# Patient Record
Sex: Female | Born: 1990 | Race: Black or African American | Hispanic: No | Marital: Single | Smoking: Never smoker
Health system: Southern US, Community
[De-identification: ages and names within clinical notes are randomized; demographics above are authoritative.]

## PROBLEM LIST (undated history)

## (undated) ENCOUNTER — Inpatient Hospital Stay (HOSPITAL_COMMUNITY): Payer: Self-pay

## (undated) DIAGNOSIS — R51 Headache: Secondary | ICD-10-CM

## (undated) DIAGNOSIS — R519 Headache, unspecified: Secondary | ICD-10-CM

## (undated) DIAGNOSIS — J45909 Unspecified asthma, uncomplicated: Secondary | ICD-10-CM

## (undated) DIAGNOSIS — Z202 Contact with and (suspected) exposure to infections with a predominantly sexual mode of transmission: Secondary | ICD-10-CM

## (undated) DIAGNOSIS — A749 Chlamydial infection, unspecified: Secondary | ICD-10-CM

## (undated) DIAGNOSIS — R011 Cardiac murmur, unspecified: Secondary | ICD-10-CM

## (undated) HISTORY — PX: MOUTH SURGERY: SHX715

---

## 2018-06-26 ENCOUNTER — Encounter (HOSPITAL_COMMUNITY): Payer: Self-pay | Admitting: *Deleted

## 2018-06-26 ENCOUNTER — Inpatient Hospital Stay (HOSPITAL_COMMUNITY)
Admission: AD | Admit: 2018-06-26 | Discharge: 2018-06-26 | Disposition: A | Discharge status: Home / Self Care | Payer: Self-pay | Source: Ambulatory Visit | Attending: Obstetrics and Gynecology | Admitting: Obstetrics and Gynecology

## 2018-06-26 DIAGNOSIS — O0932 Supervision of pregnancy with insufficient antenatal care, second trimester: Secondary | ICD-10-CM | POA: Insufficient documentation

## 2018-06-26 DIAGNOSIS — O26892 Other specified pregnancy related conditions, second trimester: Secondary | ICD-10-CM | POA: Insufficient documentation

## 2018-06-26 DIAGNOSIS — A599 Trichomoniasis, unspecified: Secondary | ICD-10-CM | POA: Insufficient documentation

## 2018-06-26 DIAGNOSIS — N949 Unspecified condition associated with female genital organs and menstrual cycle: Secondary | ICD-10-CM

## 2018-06-26 DIAGNOSIS — Z3A19 19 weeks gestation of pregnancy: Secondary | ICD-10-CM | POA: Insufficient documentation

## 2018-06-26 DIAGNOSIS — J45909 Unspecified asthma, uncomplicated: Secondary | ICD-10-CM | POA: Insufficient documentation

## 2018-06-26 DIAGNOSIS — A749 Chlamydial infection, unspecified: Secondary | ICD-10-CM | POA: Insufficient documentation

## 2018-06-26 DIAGNOSIS — O98812 Other maternal infectious and parasitic diseases complicating pregnancy, second trimester: Secondary | ICD-10-CM | POA: Insufficient documentation

## 2018-06-26 DIAGNOSIS — O99512 Diseases of the respiratory system complicating pregnancy, second trimester: Secondary | ICD-10-CM | POA: Insufficient documentation

## 2018-06-26 DIAGNOSIS — R102 Pelvic and perineal pain: Secondary | ICD-10-CM | POA: Insufficient documentation

## 2018-06-26 DIAGNOSIS — R109 Unspecified abdominal pain: Secondary | ICD-10-CM

## 2018-06-26 DIAGNOSIS — O26899 Other specified pregnancy related conditions, unspecified trimester: Secondary | ICD-10-CM

## 2018-06-26 HISTORY — DX: Headache: R51

## 2018-06-26 HISTORY — DX: Contact with and (suspected) exposure to infections with a predominantly sexual mode of transmission: Z20.2

## 2018-06-26 HISTORY — DX: Cardiac murmur, unspecified: R01.1

## 2018-06-26 HISTORY — DX: Chlamydial infection, unspecified: A74.9

## 2018-06-26 HISTORY — DX: Headache, unspecified: R51.9

## 2018-06-26 HISTORY — DX: Unspecified asthma, uncomplicated: J45.909

## 2018-06-26 LAB — URINALYSIS, ROUTINE W REFLEX MICROSCOPIC
Bilirubin Urine: NEGATIVE
GLUCOSE, UA: NEGATIVE mg/dL
KETONES UR: NEGATIVE mg/dL
NITRITE: NEGATIVE
PH: 5 (ref 5.0–8.0)
Protein, ur: NEGATIVE mg/dL
Specific Gravity, Urine: 1.031 — ABNORMAL HIGH (ref 1.005–1.030)

## 2018-06-26 LAB — WET PREP, GENITAL
Clue Cells Wet Prep HPF POC: NONE SEEN
SPERM: NONE SEEN
TRICH WET PREP: NONE SEEN
Yeast Wet Prep HPF POC: NONE SEEN

## 2018-06-26 NOTE — MAU Provider Note (Addendum)
History     CSN: [id number]  Arrival date and time: 06/26/18 0844   First Provider Initiated Contact with Patient 06/26/18 [phone]      Chief Complaint  Patient presents with  . no fetal movement   G3P2002 @19 .4 wks by LMP here to "check on the baby". Moved here from [facility] yesterday d/t "stressful situation". She denies VB but reports LAP, intermittently throughout the pregnancy. Pain is more mild today. Describes as sharp and shooting. She is not feeling FM yet. Did not receive care in [place] but had several ED visits. Reports being treated for Trich earlier in the pregnancy. Currently staying at [facility] with her daughter.    OB History    Gravida  3   Para  2   Term  2   Preterm  0   AB  0   Living  2     SAB      TAB      Ectopic      Multiple      Live Births              Past Medical History:  Diagnosis Date  . Asthma   . Chlamydia   . Headache   . Heart murmur   . Trichomonas contact     Past Surgical History:  Procedure Laterality Date  . MOUTH SURGERY      No family history on file.  Social History   Tobacco Use  . Smoking status: Never Smoker  . Smokeless tobacco: Never Used  Substance Use Topics  . Alcohol use: Never    Frequency: Never  . Drug use: Never    Allergies: Allergies not on file  No medications prior to admission.    Review of Systems  Gastrointestinal: Positive for abdominal pain.  Genitourinary: Negative for vaginal bleeding.   Physical Exam   Blood pressure 104/61, pulse 85, temperature 98.2 F (36.8 C), temperature source Oral, resp. rate 20, height 5\' 3"  (1.6 m), weight 68 kg.  Physical Exam  Constitutional: She is oriented to person, place, and time. She appears well-developed and well-nourished. No distress.  HENT:  Head: Normocephalic and atraumatic.  Neck: Normal range of motion.  Cardiovascular: Normal rate.  Respiratory: Effort normal. No respiratory distress.  GI: Soft. She exhibits no  distension. There is no tenderness.  Gravid @unbilicus    Genitourinary:  Genitourinary Comments: VE: closed/long  Musculoskeletal: Normal range of motion.  Neurological: She is alert and oriented to person, place, and time.  Skin: Skin is warm and dry.  Psychiatric: She has a normal mood and affect.  FHT 160  Results for orders placed or performed during the hospital encounter of 06/26/18 (from the past 24 hour(s))  Wet prep, genital     Status: Abnormal   Collection Time: 06/26/18  9:48 AM  Result Value Ref Range   Yeast Wet Prep HPF POC NONE SEEN NONE SEEN   Trich, Wet Prep NONE SEEN NONE SEEN   Clue Cells Wet Prep HPF POC NONE SEEN NONE SEEN   WBC, Wet Prep HPF POC FEW (A) NONE SEEN   Sperm NONE SEEN   Urinalysis, Routine w reflex microscopic     Status: Abnormal   Collection Time: 06/26/18  9:50 AM  Result Value Ref Range   Color, Urine YELLOW YELLOW   APPearance HAZY (A) CLEAR   Specific Gravity, Urine 1.031 (H) 1.005 - 1.030   pH 5.0 5.0 - 8.0   Glucose, UA  NEGATIVE NEGATIVE mg/dL   Hgb urine dipstick MODERATE (A) NEGATIVE   Bilirubin Urine NEGATIVE NEGATIVE   Ketones, ur NEGATIVE NEGATIVE mg/dL   Protein, ur NEGATIVE NEGATIVE mg/dL   Nitrite NEGATIVE NEGATIVE   Leukocytes, UA SMALL (A) NEGATIVE   RBC / HPF 21-50 0 - 5 RBC/hpf   WBC, UA 6-10 0 - 5 WBC/hpf   Bacteria, UA RARE (A) NONE SEEN   Squamous Epithelial / LPF 11-20 0 - 5   Mucus PRESENT    MAU Course  Procedures  MDM Labs ordered and reviewed. UA with Hgb, asymptomatic, will send UC. No evidence imminent SAB. St[facility] the [facility] provided. Pregnancy verification letter provided. Stable for discharge home.   Assessment and Plan   1. [redacted] weeks gestation of pregnancy   2. Abdominal pain affecting pregnancy   3. Round ligament pain   4. Insufficient prenatal care in second trimester    Discharge home Follow up with OB provider of choice asap to start care SAB/return  precautions  Allergies as of 06/26/2018   Not on File     Medication List    You have not been prescribed any medications.    Donette LarryMelanie Preslynn Bier, CNM 06/26/2018, 10:56 AM

## 2018-06-26 NOTE — MAU Note (Signed)
[facility], was getting [facility] there, hasn't established care here.  States her last prenatal visit was in June.  Has not started feeling fetal movement at all.  Has occasional lower abdominal pain, intermittent, none today.  Denies bleeding.

## 2018-06-26 NOTE — Discharge Instructions (Signed)
Second Trimester of Pregnancy The second trimester is from week 13 through week 28, month 4 through 6. This is often the time in pregnancy that you feel your best. Often times, morning sickness has lessened or quit. You may have more energy, and you may get hungry more often. Your unborn baby (fetus) is growing rapidly. At the end of the sixth month, he or she is about 9 inches long and weighs about 1 pounds. You will likely feel the baby move (quickening) between 18 and 20 weeks of pregnancy. Follow these instructions at home:  Avoid all smoking, herbs, and alcohol. Avoid drugs not approved by your doctor.  Do not use any tobacco products, including cigarettes, chewing tobacco, and electronic cigarettes. If you need help quitting, ask your doctor. You may get counseling or other support to help you quit.  Only take medicine as told by your doctor. Some medicines are safe and some are not during pregnancy.  Exercise only as told by your doctor. Stop exercising if you start having cramps.  Eat regular, healthy meals.  Wear a good support bra if your breasts are tender.  Do not use hot tubs, steam rooms, or saunas.  Wear your seat belt when driving.  Avoid raw meat, uncooked cheese, and liter boxes and soil used by cats.  Take your prenatal vitamins.  Take 1500-2000 milligrams of calcium daily starting at the 20th week of pregnancy until you deliver your baby.  Try taking medicine that helps you poop (stool softener) as needed, and if your doctor approves. Eat more fiber by eating fresh fruit, vegetables, and whole grains. Drink enough fluids to keep your pee (urine) clear or pale yellow.  Take warm water baths (sitz baths) to soothe pain or discomfort caused by hemorrhoids. Use hemorrhoid cream if your doctor approves.  If you have puffy, bulging veins (varicose veins), wear support hose. Raise (elevate) your feet for 15 minutes, 3-4 times a day. Limit salt in your diet.  Avoid heavy  lifting, wear low heals, and sit up straight.  Rest with your legs raised if you have leg cramps or low back pain.  Visit your dentist if you have not gone during your pregnancy. Use a soft toothbrush to brush your teeth. Be gentle when you floss.  You can have sex (intercourse) unless your doctor tells you not to.  Go to your doctor visits. Get help if:  You feel dizzy.  You have mild cramps or pressure in your lower belly (abdomen).  You have a nagging pain in your belly area.  You continue to feel sick to your stomach (nauseous), throw up (vomit), or have watery poop (diarrhea).  You have bad smelling fluid coming from your vagina.  You have pain with peeing (urination). Get help right away if:  You have a fever.  You are leaking fluid from your vagina.  You have spotting or bleeding from your vagina.  You have severe belly cramping or pain.  You lose or gain weight rapidly.  You have trouble catching your breath and have chest pain.  You notice sudden or extreme puffiness (swelling) of your face, hands, ankles, feet, or legs.  You have not felt the baby move in over an hour.  You have severe headaches that do not go away with medicine.  You have vision changes. This information is not intended to replace advice given to you by your health care provider. Make sure you discuss any questions you have with your health care  provider. Document Released: 01/10/2010 Document Revised: 03/23/2016 Document Reviewed: 12/17/2012 Elsevier Interactive Patient Education  2017 Elsevier Inc.    Abdominal Pain During Pregnancy Abdominal pain is common in pregnancy. Most of the time, it does not cause harm. There are many causes of abdominal pain. Some causes are more serious than others and sometimes the cause is not known. Abdominal pain can be a sign that something is very wrong with the pregnancy or the pain may have nothing to do with the pregnancy. Always tell your health  care provider if you have any abdominal pain. Follow these instructions at home:  Do not have sex or put anything in your vagina until your symptoms go away completely.  Watch your abdominal pain for any changes.  Get plenty of rest until your pain improves.  Drink enough fluid to keep your urine clear or pale yellow.  Take over-the-counter or prescription medicines only as told by your health care provider.  Keep all follow-up visits as told by your health care provider. This is important. Contact a health care provider if:  You have a fever.  Your pain gets worse or you have cramping.  Your pain continues after resting. Get help right away if:  You are bleeding, leaking fluid, or passing tissue from the vagina.  You have vomiting or diarrhea that does not go away.  You have painful or bloody urination.  You notice a decrease in your baby's movements.  You feel very weak or faint.  You have shortness of breath.  You develop a severe headache with abdominal pain.  You have abnormal vaginal discharge with abdominal pain. This information is not intended to replace advice given to you by your health care provider. Make sure you discuss any questions you have with your health care provider. Document Released: 10/16/2005 Document Revised: 07/27/2016 Document Reviewed: 05/15/2013 Elsevier Interactive Patient Education  [employer]2018 Elsevier Inc.

## 2018-06-27 ENCOUNTER — Telehealth: Payer: Self-pay | Admitting: General Practice

## 2018-06-27 LAB — CULTURE, OB URINE

## 2018-06-27 NOTE — Telephone Encounter (Signed)
Patient aware of appt change to Friday, 07/05/18 at 10:00am.

## 2018-06-28 LAB — GC/CHLAMYDIA PROBE AMP (~~LOC~~) NOT AT ARMC
Chlamydia: NEGATIVE
Neisseria Gonorrhea: NEGATIVE

## 2018-07-10 ENCOUNTER — Other Ambulatory Visit: Payer: Self-pay

## 2018-07-10 ENCOUNTER — Ambulatory Visit: Payer: Self-pay | Admitting: *Deleted

## 2018-07-10 DIAGNOSIS — Z3480 Encounter for supervision of other normal pregnancy, unspecified trimester: Secondary | ICD-10-CM | POA: Insufficient documentation

## 2018-07-10 LAB — POCT URINALYSIS DIPSTICK OB
BILIRUBIN UA: NEGATIVE
Glucose, UA: NEGATIVE
KETONES UA: NEGATIVE
Nitrite, UA: NEGATIVE
PH UA: 7 (ref 5.0–8.0)
POC,PROTEIN,UA: NEGATIVE
RBC UA: NEGATIVE
Spec Grav, UA: 1.025 (ref 1.010–1.025)
UROBILINOGEN UA: 0.2 U/dL

## 2018-07-10 NOTE — Progress Notes (Signed)
   PRENATAL INTAKE SUMMARY  Ms. Rote presents today New OB Nurse Interview.  OB History    Gravida  3   Para  2   Term  2   Preterm  0   AB  0   Living  2     SAB      TAB      Ectopic      Multiple      Live Births  2          I have reviewed the patient's medical, obstetrical, social, and family histories, medications, and available lab results.  SUBJECTIVE She has no unusual complaints. Late to prenatal care.  Pt is staying at [facility]. Pt recently just moved to [place].    OBJECTIVE Initial nurse interview for history and lab work (New OB).  GENERAL APPEARANCE: alert, well appearing.  ASSESSMENT Normal pregnancy. EDD: 11/14/2018; GA: [redacted]w[redacted]d; G3P2.  PLAN Prenatal care: [facility]. OB Pnl/HIV  OB Urine Culture GC/CT: negative 06/26/18 at MAU. HgbEval SMA CF A1C PAP at next visit with CNM. Pt declined prenatal labs today due to no insurance. Pt stated she will go to social services to reapply for her medicaid.  Pt to continue with PNV.  Preterm labor precautions given.   Clovis Pu, RN

## 2018-07-14 ENCOUNTER — Inpatient Hospital Stay (HOSPITAL_COMMUNITY)
Admission: AD | Admit: 2018-07-14 | Discharge: 2018-07-14 | Disposition: A | Discharge status: Home / Self Care | Payer: Self-pay | Source: Ambulatory Visit | Attending: Family Medicine | Admitting: Family Medicine

## 2018-07-14 ENCOUNTER — Other Ambulatory Visit: Payer: Self-pay

## 2018-07-14 ENCOUNTER — Inpatient Hospital Stay (HOSPITAL_BASED_OUTPATIENT_CLINIC_OR_DEPARTMENT_OTHER): Payer: Self-pay

## 2018-07-14 ENCOUNTER — Encounter (HOSPITAL_COMMUNITY): Payer: Self-pay

## 2018-07-14 DIAGNOSIS — Z91040 Latex allergy status: Secondary | ICD-10-CM | POA: Insufficient documentation

## 2018-07-14 DIAGNOSIS — O0932 Supervision of pregnancy with insufficient antenatal care, second trimester: Secondary | ICD-10-CM

## 2018-07-14 DIAGNOSIS — Z3A22 22 weeks gestation of pregnancy: Secondary | ICD-10-CM

## 2018-07-14 DIAGNOSIS — O4690 Antepartum hemorrhage, unspecified, unspecified trimester: Secondary | ICD-10-CM

## 2018-07-14 DIAGNOSIS — O26899 Other specified pregnancy related conditions, unspecified trimester: Secondary | ICD-10-CM

## 2018-07-14 DIAGNOSIS — Z3686 Encounter for antenatal screening for cervical length: Secondary | ICD-10-CM

## 2018-07-14 DIAGNOSIS — R102 Pelvic and perineal pain: Secondary | ICD-10-CM

## 2018-07-14 DIAGNOSIS — O4692 Antepartum hemorrhage, unspecified, second trimester: Secondary | ICD-10-CM

## 2018-07-14 DIAGNOSIS — Z3480 Encounter for supervision of other normal pregnancy, unspecified trimester: Secondary | ICD-10-CM

## 2018-07-14 DIAGNOSIS — O26892 Other specified pregnancy related conditions, second trimester: Secondary | ICD-10-CM

## 2018-07-14 LAB — URINALYSIS, ROUTINE W REFLEX MICROSCOPIC
Bilirubin Urine: NEGATIVE
Glucose, UA: NEGATIVE mg/dL
Hgb urine dipstick: NEGATIVE
Ketones, ur: 5 mg/dL — AB
Leukocytes, UA: NEGATIVE
Nitrite: NEGATIVE
Protein, ur: NEGATIVE mg/dL
Specific Gravity, Urine: 1.025 (ref 1.005–1.030)
pH: 5 (ref 5.0–8.0)

## 2018-07-14 LAB — WET PREP, GENITAL
Clue Cells Wet Prep HPF POC: NONE SEEN
Sperm: NONE SEEN
Trich, Wet Prep: NONE SEEN
Yeast Wet Prep HPF POC: NONE SEEN

## 2018-07-14 LAB — ABO/RH: ABO/RH(D): A POS

## 2018-07-14 MED ORDER — ACETAMINOPHEN 500 MG PO TABS: 1000.0000 mg | ORAL_TABLET | Freq: Once | ORAL | Status: DC

## 2018-07-14 NOTE — MAU Note (Signed)
Pt. States abd pain started around 0800 this morning.  Pt noticed some pink spotting around 1445 today.  Pt. Denies LOF or dc.

## 2018-07-14 NOTE — Discharge Instructions (Signed)

## 2018-07-14 NOTE — MAU Provider Note (Addendum)
History     CSN: [id number]  Arrival date and time: 07/14/18 1503   First Provider Initiated Contact with Patient 07/14/18 1554      Chief Complaint  Patient presents with  . Abdominal Pain  . Vaginal Bleeding    spotting    Nichole Patterson is a 27 y.o. G3P2 at [redacted]w[redacted]d who presents to MAU with complaints of vaginal bleeding and abdominal pain. She reports vaginal bleeding started occurring today. Describes the vaginal bleeding as pinkish red spotting when she wipes, denies having to wear a pad or panty liner for bleeding. Reports it occurring once before a couple of weeks ago but was not seen after. She moved here from [place] and has not had an [place] so is unsure of placenta location or blood type. She reports abdominal pain started occurring this week. Describes the abdominal pain as intermittent sharp stabbing pain that gets worse with movement or when she is standing up. Rates pain 7/10- has not taken any medication for abdominal pain. She denies vaginal discharge, LOF, or recent IC. She reports +FM.    OB History    Gravida  3   Para  2   Term  2   Preterm  0   AB  0   Living  2     SAB      TAB      Ectopic      Multiple      Live Births  2           Past Medical History:  Diagnosis Date  . Asthma   . Chlamydia   . Headache   . Heart murmur   . Trichomonas contact     Past Surgical History:  Procedure Laterality Date  . MOUTH SURGERY      Family History  Problem Relation Age of Onset  . Pneumonia Father     Social History   Tobacco Use  . Smoking status: Never Smoker  . Smokeless tobacco: Never Used  Substance Use Topics  . Alcohol use: Never    Frequency: Never  . Drug use: Never    Allergies:  Allergies  Allergen Reactions  . Latex Itching    Redness    Medications Prior to Admission  Medication Sig Dispense Refill Last Dose  . Prenatal Vit-Fe Fumarate-FA (MULTIVITAMIN-PRENATAL) 27-0.8 MG TABS tablet Take 1 tablet by mouth daily at 12  noon.   Taking    Review of Systems  Constitutional: Negative.   Respiratory: Negative.   Cardiovascular: Negative.   Gastrointestinal: Positive for abdominal pain. Negative for constipation, diarrhea, nausea and vomiting.  Genitourinary: Positive for vaginal bleeding. Negative for difficulty urinating, dysuria, frequency, menstrual problem, pelvic pain and urgency.  Musculoskeletal: Negative.   Neurological: Negative.    Physical Exam   Blood pressure 116/64, pulse 92, temperature 97.9 F (36.6 C), temperature source Oral, resp. rate 18, height 5\' 3"  (1.6 m), weight 71.2 kg, last menstrual period 02/07/2018, SpO2 100 %.  Physical Exam  Nursing note and vitals reviewed. Constitutional: She is oriented to person, place, and time. She appears well-developed and well-nourished. No distress.  HENT:  Head: Normocephalic.  Cardiovascular: Normal rate, regular rhythm and normal heart sounds.  Respiratory: Effort normal and breath sounds normal. No respiratory distress. She has no wheezes.  GI: Soft. There is no tenderness. There is no rebound.  Gravid appropriate for gestational age, fundus at umbilicus  Genitourinary: There is bleeding in the vagina. No tenderness in the vagina. Vaginal  discharge found.  Genitourinary Comments: Pelvic exam: cervix pink, visually closed, white thin discharge with no odor present, vaginal bleeding present at cervical os (none surrounding cervix)    Musculoskeletal: Normal range of motion.  Neurological: She is alert and oriented to person, place, and time.  Psychiatric: She has a normal mood and affect. Her behavior is normal. Thought content normal.   Dilation: Closed Cervical Position: Posterior Exam by:: V Delayza Lungren CNM  FHR 143 by bedside US due to unable to obtain with doppler   MAU Course  Procedures  MDM Wet prep GC/C  ABO/Rh  US MFM OB limited to assess for placental location and cervical length   Labs reviewed:  Results for orders  placed or performed during the hospital encounter of 07/14/18 (from the past 24 hour(s))  Wet prep, genital     Status: Abnormal   Collection Time: 07/14/18  3:07 PM  Result Value Ref Range   Yeast Wet Prep HPF POC NONE SEEN NONE SEEN   Trich, Wet Prep NONE SEEN NONE SEEN   Clue Cells Wet Prep HPF POC NONE SEEN NONE SEEN   WBC, Wet Prep HPF POC FEW (A) NONE SEEN   Sperm NONE SEEN   Urinalysis, Routine w reflex microscopic     Status: Abnormal   Collection Time: 07/14/18  3:56 PM  Result Value Ref Range   Color, Urine YELLOW YELLOW   APPearance HAZY (A) CLEAR   Specific Gravity, Urine 1.025 1.005 - 1.030   pH 5.0 5.0 - 8.0   Glucose, UA NEGATIVE NEGATIVE mg/dL   Hgb urine dipstick NEGATIVE NEGATIVE   Bilirubin Urine NEGATIVE NEGATIVE   Ketones, ur 5 (A) NEGATIVE mg/dL   Protein, ur NEGATIVE NEGATIVE mg/dL   Nitrite NEGATIVE NEGATIVE   Leukocytes, UA NEGATIVE NEGATIVE  ABO/Rh     Status: None (Preliminary result)   Collection Time: 07/14/18  4:36 PM  Result Value Ref Range   ABO/RH(D)      A POS Performed at [facility], [address] [place], [place] [address]    Wet prep- negative  GC/C- pending  UA- negative  ABO/Rh- A Pos, no rhogam needed for vaginal bleeding   US reports reviewed: Placenta anterior above cervical os (no abruption or previa seen), Presentation cephalic, AFV: WNL,  CL 4.3cm  - no abnormalities noted from US  [place] Mfm Ob Limited  Result Date: 07/14/2018 ----------------------------------------------------------------------  OBSTETRICS REPORT                        (Signed Final 07/14/2018 07:26 pm) ---------------------------------------------------------------------- Patient Info  ID #:       [id number]                          D.O.B.:  [birth year] (27 yrs)  Name:       Nichole Patterson                      Visit Date: 07/14/2018 04:38 pm ---------------------------------------------------------------------- Performed By  Performed By:     Birdena CrandallYasemin Karatas         Ref. Address:      Faculty                    RDMS,RVT  Attending:        Toma CopierSaju Joy MD            Secondary Phy.:    MAU  Nursing-                                                              MAU/Triage  Referred By:      Rudean Curt             Location:          [facility]                    Tierrah Anastos CNM ---------------------------------------------------------------------- Orders   #  Description                           Code        Ordered By   1  [place] MFM OB LIMITED                     [phone]    Tauri Ethington  ----------------------------------------------------------------------   #  Order #                     Accession #                Episode #   1  [id number]                   [id number]                 [id number]  ---------------------------------------------------------------------- Indications   Encounter for cervical length                  Z36.86   Late prenatal care, second trimester           O09.32   Vaginal bleeding in pregnancy, second          O46.92   trimester   [redacted] weeks gestation of pregnancy                Z3A.22  ---------------------------------------------------------------------- Vital Signs  Weight (lb): 157                               Height:        5'3"  BMI:         27.81 ---------------------------------------------------------------------- Fetal Evaluation  Num Of Fetuses:          1  Fetal Heart Rate(bpm):   157  Cardiac Activity:        Observed  Presentation:            Cephalic  Placenta:                Anterior  P. Cord Insertion:       Visualized, central  Amniotic Fluid  AFI FV:      Within normal limits                              Largest Pocket(cm)                              5.6  Comment:    No placental abruption or previa identified. ---------------------------------------------------------------------- Biometry  CM:  5.6  mm ---------------------------------------------------------------------- OB History  Gravidity:    3         Term:   2        Prem:   0   Living:       2 ---------------------------------------------------------------------- Gestational Age  LMP:           22w 3d        Date:  02/07/18                 EDD:   11/14/18  Best:          Maudie Mercury 3d     Det. By:  LMP  (02/07/18)          EDD:   11/14/18 ---------------------------------------------------------------------- Anatomy  Cranium:               Appears normal         Aortic Arch:            Appears normal  Cavum:                 Appears normal         Ductal Arch:            Not well visualized  Ventricles:            Appears normal         Diaphragm:              Appears normal  Choroid Plexus:        Appears normal         Stomach:                Appears normal, left                                                                        sided  Cerebellum:            Appears normal         Abdomen:                Appears normal  Posterior Fossa:       Appears normal         Abdominal Wall:         Appears nml (cord                                                                        insert, abd wall)  Face:                  Appears normal         Cord Vessels:           Appears normal (3                         (orbits and profile)  vessel cord)  Lips:                  Appears normal         Kidneys:                Appear normal  Palate:                Not well visualized    Bladder:                Appears normal  Thoracic:              Appears normal         Spine:                  Appears normal  Heart:                 Appears normal         Upper Extremities:      Not well visualized                         (4CH, axis, and                         situs)  RVOT:                  Appears normal         Lower Extremities:      Not well visualized  LVOT:                  Appears normal  Other:  Fetus appears to be female. Heels not seen seen. 5th digit visualized.          Nasal bone visualized. Open hands visualized.  ---------------------------------------------------------------------- Cervix Uterus Adnexa  Cervix  Length:            4.3  cm.  Normal appearance by transabdominal scan.  Uterus  No abnormality visualized.  Left Ovary  Normal, measuring  Right Ovary  Normal, measuring  Adnexa  No abnormality visualized. ---------------------------------------------------------------------- Impression  IUP at 22w 3d  CL appears reassuring at 4.3 cms  Normal AFI ----------------------------------------------------------------------                      Toma Copier, MD Electronically Signed Final Report   07/14/2018 07:26 pm ----------------------------------------------------------------------  Labs and [place] reviewed with patient. Educated and discussed vaginal bleeding in pregnancy and reasons to return to MAU- patient verbalizes understanding. Has initial prenatal appointment scheduled for next week at [facility]. Pelvic rest until vaginal bleeding stops. Pt stable at time of discharge.   Assessment and Plan   1. Pain of round ligament during pregnancy   2. Supervision of other normal pregnancy, antepartum   3. Vaginal bleeding during pregnancy, antepartum   4. Late prenatal care affecting pregnancy in second trimester   5. Encounter for antenatal screening for cervical length    Discharge home  Follow up as scheduled for initial prenatal appointment  Continue prenatal vitamins  Return to MAU as needed for emergencies  Preterm labor precautions and fetal kick counts  Pelvic rest and Hydration   Follow-up Information    [facility] Follow up.   Specialty:  Obstetrics and Gynecology Contact information: [address] [place] [phone]          Allergies as  of 07/14/2018      Reactions   Latex Itching   Redness      Medication List    TAKE these medications   multivitamin-prenatal 27-0.8 MG Tabs tablet Take 1 tablet by mouth daily at 12  noon.       Sharyon Cable CNM 07/14/2018, 7:32 PM

## 2018-07-15 LAB — GC/CHLAMYDIA PROBE AMP (~~LOC~~) NOT AT ARMC
Chlamydia: NEGATIVE
Neisseria Gonorrhea: NEGATIVE

## 2018-07-25 ENCOUNTER — Encounter: Payer: Self-pay | Admitting: Obstetrics and Gynecology

## 2018-08-14 ENCOUNTER — Inpatient Hospital Stay (HOSPITAL_COMMUNITY)
Admission: AD | Admit: 2018-08-14 | Discharge: 2018-08-14 | Disposition: A | Discharge status: Home / Self Care | Payer: Medicaid Other | Source: Ambulatory Visit | Attending: Obstetrics and Gynecology | Admitting: Obstetrics and Gynecology

## 2018-08-14 ENCOUNTER — Encounter (HOSPITAL_COMMUNITY): Payer: Self-pay

## 2018-08-14 DIAGNOSIS — O99512 Diseases of the respiratory system complicating pregnancy, second trimester: Secondary | ICD-10-CM | POA: Insufficient documentation

## 2018-08-14 DIAGNOSIS — M7918 Myalgia, other site: Secondary | ICD-10-CM | POA: Insufficient documentation

## 2018-08-14 DIAGNOSIS — O26892 Other specified pregnancy related conditions, second trimester: Secondary | ICD-10-CM | POA: Diagnosis present

## 2018-08-14 DIAGNOSIS — R109 Unspecified abdominal pain: Secondary | ICD-10-CM | POA: Insufficient documentation

## 2018-08-14 DIAGNOSIS — Z3A26 26 weeks gestation of pregnancy: Secondary | ICD-10-CM | POA: Insufficient documentation

## 2018-08-14 DIAGNOSIS — J45909 Unspecified asthma, uncomplicated: Secondary | ICD-10-CM | POA: Insufficient documentation

## 2018-08-14 LAB — URINALYSIS, ROUTINE W REFLEX MICROSCOPIC
Bilirubin Urine: NEGATIVE
Glucose, UA: NEGATIVE mg/dL
Hgb urine dipstick: NEGATIVE
Ketones, ur: NEGATIVE mg/dL
NITRITE: NEGATIVE
PH: 6 (ref 5.0–8.0)
Protein, ur: NEGATIVE mg/dL
SPECIFIC GRAVITY, URINE: 1.02 (ref 1.005–1.030)

## 2018-08-14 MED ORDER — IBUPROFEN 600 MG PO TABS: 600.0000 mg | ORAL_TABLET | Freq: Four times a day (QID) | ORAL | 0 refills | Status: AC | PRN

## 2018-08-14 MED ORDER — CYCLOBENZAPRINE HCL 10 MG PO TABS: 10.0000 mg | ORAL_TABLET | Freq: Three times a day (TID) | ORAL | 2 refills | Status: DC | PRN

## 2018-08-14 MED ORDER — IBUPROFEN 600 MG PO TABS
600.0000 mg | ORAL_TABLET | Freq: Four times a day (QID) | ORAL | Status: DC | PRN
  Administered 2018-08-14: 600 mg via ORAL
  Filled 2018-08-14: qty 1

## 2018-08-14 MED ORDER — CYCLOBENZAPRINE HCL 5 MG PO TABS
5.0000 mg | ORAL_TABLET | Freq: Once | ORAL | Status: AC
  Administered 2018-08-14: 5 mg via ORAL
  Filled 2018-08-14: qty 1

## 2018-08-14 MED ORDER — FAMOTIDINE IN NACL 20-0.9 MG/50ML-% IV SOLN: 20.0000 mg | Freq: Once | INTRAVENOUS | Status: DC

## 2018-08-14 NOTE — MAU Provider Note (Signed)
Chief Complaint: Abdominal Pain   None     SUBJECTIVE HPI: Nichole Patterson is a 27 y.o. 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 The pain is still present, especially when she walks or sits up. It is constant sharp pain that increases with movement and improves with rest. She also has not felt her baby move today, she last felt movement yesterday which is unusual. There are no other symptoms. She denies any vaginal discharge, LOF, intermittent cramping pain, n/v, or vaginal bleeding.    HPI  Past Medical History:  Diagnosis Date  . Asthma   . Chlamydia   . Headache   . Heart murmur   . Trichomonas contact    Past Surgical History:  Procedure Laterality Date  . MOUTH SURGERY     Social History   Socioeconomic History  . Marital status: Single    Spouse name: Not on file  . Number of children: 2  . Years of education: some college  . Highest education level: 12th grade  Occupational History  . Occupation: Unemployed  Social Needs  . Financial resource strain: Not on file  . Food insecurity:    Worry: Not on file    Inability: Not on file  . Transportation needs:    Medical: Not on file    Non-medical: Not on file  Tobacco Use  . Smoking status: Never Smoker  . Smokeless tobacco: Never Used  Substance and Sexual Activity  . Alcohol use: Never    Frequency: Never  . Drug use: Never  . Sexual activity: Yes    Birth control/protection: None  Lifestyle  . Physical activity:    Days per week: Not on file    Minutes per session: Not on file  . Stress: Not on file  Relationships  . Social connections:    Talks on phone: Not on file    Gets together: Not on file    Attends religious service: Not on file    Active member of club or organization: Not on file    Attends meetings of clubs or organizations: Not on file    Relationship  status: Not on file  . Intimate partner violence:    Fear of current or ex partner: Not on file    Emotionally abused: Not on file    Physically abused: Not on file    Forced sexual activity: Not on file  Other Topics Concern  . Not on file  Social History Narrative  . Not on file   No current facility-administered medications on file prior to encounter.    Current Outpatient Medications on File Prior to Encounter  Medication Sig Dispense Refill  . Prenatal Vit-Fe Fumarate-FA (MULTIVITAMIN-PRENATAL) 27-0.8 MG TABS tablet Take 1 tablet by mouth daily at 12 noon.     Allergies  Allergen Reactions  . Latex Itching    Redness    ROS:  Review of Systems  Constitutional: Negative for chills, fatigue and fever.  Eyes: Negative for visual disturbance.  Respiratory: Negative for shortness of breath.   Cardiovascular: Negative for chest pain.  Gastrointestinal: Positive for abdominal pain. Negative for nausea and vomiting.  Genitourinary: Positive for pelvic pain and vaginal pain. Negative for difficulty urinating, dysuria, flank pain, vaginal bleeding and vaginal discharge.  Neurological: Negative for dizziness and headaches.  Psychiatric/Behavioral: Negative.  I have reviewed patient's Past Medical Hx, Surgical Hx, Family Hx, Social Hx, medications and allergies.   Physical Exam   Patient Vitals for the past 24 hrs:  BP Temp Temp src Pulse Resp SpO2 Weight  08/14/18 1054 (!) 111/58 98 F (36.7 C) Oral (!) 101 20 100 % 75.2 kg   Constitutional: Well-developed, well-nourished female in no acute distress.  Cardiovascular: normal rate Respiratory: normal effort GI: Abd soft, non-tender. No rebound tenderness or guarding. Pos BS x 4 MS: Extremities nontender, no edema, normal ROM Neurologic: Alert and oriented x 4.  GU: Neg CVAT.  Dilation: Closed Effacement (%): Thick Cervical Position: Posterior Exam by:: leftwich kirby cnm  FHR baseline 150, moderate variability,  accels present and no decels Contractions: none on toco or to palpation  LAB RESULTS Results for orders placed or performed during the hospital encounter of 08/14/18 (from the past 24 hour(s))  Urinalysis, Routine w reflex microscopic     Status: Abnormal   Collection Time: 08/14/18 11:40 AM  Result Value Ref Range   Color, Urine YELLOW YELLOW   APPearance HAZY (A) CLEAR   Specific Gravity, Urine 1.020 1.005 - 1.030   pH 6.0 5.0 - 8.0   Glucose, UA NEGATIVE NEGATIVE mg/dL   Hgb urine dipstick NEGATIVE NEGATIVE   Bilirubin Urine NEGATIVE NEGATIVE   Ketones, ur NEGATIVE NEGATIVE mg/dL   Protein, ur NEGATIVE NEGATIVE mg/dL   Nitrite NEGATIVE NEGATIVE   Leukocytes, UA TRACE (A) NEGATIVE   RBC / HPF 0-5 0 - 5 RBC/hpf   WBC, UA 0-5 0 - 5 WBC/hpf   Bacteria, UA RARE (A) NONE SEEN   Squamous Epithelial / LPF 11-20 0 - 5   Mucus PRESENT     --/--/A POS Performed at [facility], [address], [place], [place] [address]  (09/15 1636)  IMAGING No results found.  MAU Management/MDM: NST reactive, pt feeling fetal movement in MAU.  No acute abdomen, no n/v, low suspicion for appendicidis. Pt declines CBC today in MAU.  Pain is likely musculoskeletal from physical job.  Flexeril 5 mg and ibuprofen 600 mg given in MAU. Rx for Flexeril and short course of ibuprofen x 3 days only sent to pharmacy. Note for pt to miss work x 3 days, return with pregnancy restrictions.  F/U at [facility] as scheduled.  Pt discharged with strict return precautions.  ASSESSMENT 1. Musculoskeletal pain   2. Abdominal pain during pregnancy in second trimester     PLAN Discharge home Allergies as of 08/14/2018      Reactions   Latex Itching   Redness      Medication List    TAKE these medications   cyclobenzaprine 10 MG tablet Commonly known as:  FLEXERIL Take 1 tablet (10 mg total) by mouth 3 (three) times daily as needed for muscle spasms.   ibuprofen 600 MG tablet Commonly known as:   ADVIL,MOTRIN Take 1 tablet (600 mg total) by mouth every 6 (six) hours as needed for up to 3 days for moderate pain.   multivitamin-prenatal 27-0.8 MG Tabs tablet Take 1 tablet by mouth daily at 12 noon.      Follow-up Information    [facility] Follow up.   Specialty:  Obstetrics and Gynecology Why:  As scheduled, return to MAU as needed for emergencies. Contact information: [address] [place] [address] [phone]          Sharen Counter Certified Nurse-Midwife 08/14/2018  1:20 PM

## 2018-08-14 NOTE — MAU Note (Signed)
Pain in RLQ, started this morning at work.  Was lifting, ?pulled something- causing sharp pains in vagina. Hurts when she walks and if she sits down to fast.

## 2018-09-05 ENCOUNTER — Encounter: Payer: Self-pay | Admitting: Obstetrics and Gynecology

## 2018-09-10 ENCOUNTER — Other Ambulatory Visit (INDEPENDENT_AMBULATORY_CARE_PROVIDER_SITE_OTHER): Payer: Medicaid Other | Admitting: *Deleted

## 2018-09-10 ENCOUNTER — Other Ambulatory Visit (HOSPITAL_COMMUNITY)
Admission: RE | Admit: 2018-09-10 | Discharge: 2018-09-10 | Disposition: A | Discharge status: Home / Self Care | Payer: Medicaid Other | Source: Ambulatory Visit | Attending: Obstetrics and Gynecology | Admitting: Obstetrics and Gynecology

## 2018-09-10 ENCOUNTER — Encounter: Payer: Self-pay | Admitting: General Practice

## 2018-09-10 ENCOUNTER — Other Ambulatory Visit: Payer: Self-pay

## 2018-09-10 DIAGNOSIS — Z3480 Encounter for supervision of other normal pregnancy, unspecified trimester: Secondary | ICD-10-CM | POA: Insufficient documentation

## 2018-09-10 DIAGNOSIS — Z23 Encounter for immunization: Secondary | ICD-10-CM | POA: Diagnosis not present

## 2018-09-10 DIAGNOSIS — Z3483 Encounter for supervision of other normal pregnancy, third trimester: Secondary | ICD-10-CM

## 2018-09-10 LAB — POCT URINALYSIS DIPSTICK OB
BILIRUBIN UA: NEGATIVE
Blood, UA: NEGATIVE
GLUCOSE, UA: NEGATIVE
KETONES UA: NEGATIVE
Nitrite, UA: NEGATIVE
SPEC GRAV UA: 1.025 (ref 1.010–1.025)
Urobilinogen, UA: 1 E.U./dL
pH, UA: 7 (ref 5.0–8.0)

## 2018-09-10 MED ORDER — ONE-A-DAY WOMENS PRENATAL 1 28-0.8-235 MG PO CAPS: 1.0000 | ORAL_CAPSULE | Freq: Every day | ORAL | 0 refills | Status: DC

## 2018-09-10 NOTE — Progress Notes (Signed)
   Patient in nurse clinic for initial OB prenatal and 28 week lab work. Patient signed BTL papers today. Pt to return next week for glucose testing. Ultrasound OB complete +14 weeks ordered. One a day Prenatal vitamin #4 boxes (sample) given.  Clovis PuMartin, Tamika L, RN

## 2018-09-11 ENCOUNTER — Encounter: Payer: Self-pay | Admitting: General Practice

## 2018-09-11 LAB — CERVICOVAGINAL ANCILLARY ONLY
Bacterial vaginitis: NEGATIVE
CANDIDA VAGINITIS: POSITIVE — AB
CHLAMYDIA, DNA PROBE: NEGATIVE
Neisseria Gonorrhea: NEGATIVE
Trichomonas: NEGATIVE

## 2018-09-11 LAB — OBSTETRIC PANEL, INCLUDING HIV
Antibody Screen: NEGATIVE
Basophils Absolute: 0 10*3/uL (ref 0.0–0.2)
Basos: 0 %
EOS (ABSOLUTE): 0.1 10*3/uL (ref 0.0–0.4)
EOS: 1 %
HEMOGLOBIN: 10.1 g/dL — AB (ref 11.1–15.9)
HEP B S AG: NEGATIVE
HIV Screen 4th Generation wRfx: NONREACTIVE
Hematocrit: 31.2 % — ABNORMAL LOW (ref 34.0–46.6)
IMMATURE GRANS (ABS): 0.1 10*3/uL (ref 0.0–0.1)
IMMATURE GRANULOCYTES: 1 %
LYMPHS: 22 %
Lymphocytes Absolute: 2.9 10*3/uL (ref 0.7–3.1)
MCH: 27.9 pg (ref 26.6–33.0)
MCHC: 32.4 g/dL (ref 31.5–35.7)
MCV: 86 fL (ref 79–97)
MONOS ABS: 0.9 10*3/uL (ref 0.1–0.9)
Monocytes: 6 %
NEUTROS PCT: 70 %
Neutrophils Absolute: 9.5 10*3/uL — ABNORMAL HIGH (ref 1.4–7.0)
Platelets: 247 10*3/uL (ref 150–450)
RBC: 3.62 x10E6/uL — AB (ref 3.77–5.28)
RDW: 12 % — ABNORMAL LOW (ref 12.3–15.4)
RH TYPE: POSITIVE
RPR: NONREACTIVE
Rubella Antibodies, IGG: 11.1 index (ref >0.99)
WBC: 13.5 10*3/uL — ABNORMAL HIGH (ref 3.4–10.8)

## 2018-09-12 ENCOUNTER — Telehealth: Payer: Self-pay | Admitting: *Deleted

## 2018-09-12 DIAGNOSIS — B373 Candidiasis of vulva and vagina: Secondary | ICD-10-CM

## 2018-09-12 DIAGNOSIS — B3731 Acute candidiasis of vulva and vagina: Secondary | ICD-10-CM

## 2018-09-12 LAB — HEMOGLOBINOPATHY EVALUATION
FERRITIN: 7 ng/mL — AB (ref 15–150)
HGB VARIANT: 0 %
Hematocrit: 31.7 % — ABNORMAL LOW (ref 34.0–46.6)
Hemoglobin: 10.2 g/dL — ABNORMAL LOW (ref 11.1–15.9)
Hgb A2 Quant: 2.1 % (ref 1.8–3.2)
Hgb A: 97.9 % (ref 96.4–98.8)
Hgb C: 0 %
Hgb F Quant: 0 % (ref 0.0–2.0)
Hgb S: 0 %
Hgb Solubility: NEGATIVE
MCH: 28.1 pg (ref 26.6–33.0)
MCHC: 32.2 g/dL (ref 31.5–35.7)
MCV: 87 fL (ref 79–97)
PLATELETS: 252 10*3/uL (ref 150–450)
RBC: 3.63 x10E6/uL — ABNORMAL LOW (ref 3.77–5.28)
RDW: 12.1 % — AB (ref 12.3–15.4)
WBC: 13.5 10*3/uL — ABNORMAL HIGH (ref 3.4–10.8)

## 2018-09-12 NOTE — Telephone Encounter (Signed)
Left voice message for patient to return nurse call. Positive yeast.  Clovis PuMartin, Tamika L, RN

## 2018-09-12 NOTE — Telephone Encounter (Signed)
-----   Message from Raelyn Moraolitta Dawson, [place] sent at 09/11/2018  8:52 PM EST ----- Send Rx for Terazol cream

## 2018-09-13 ENCOUNTER — Encounter (HOSPITAL_COMMUNITY): Payer: Self-pay

## 2018-09-13 LAB — URINE CULTURE, OB REFLEX

## 2018-09-13 LAB — CULTURE, OB URINE

## 2018-09-13 NOTE — Telephone Encounter (Signed)
Left voice message needing pharmacy to send vaginal cream for yeast infection.  Clovis PuMartin, Tamika L, RN

## 2018-09-16 ENCOUNTER — Other Ambulatory Visit: Payer: Medicaid Other | Admitting: *Deleted

## 2018-09-16 DIAGNOSIS — Z3480 Encounter for supervision of other normal pregnancy, unspecified trimester: Secondary | ICD-10-CM

## 2018-09-16 LAB — SMN1 COPY NUMBER ANALYSIS (SMA CARRIER SCREENING)

## 2018-09-16 MED ORDER — TERCONAZOLE 0.4 % VA CREA: 1.0000 | TOPICAL_CREAM | Freq: Every day | VAGINAL | 0 refills | Status: DC

## 2018-09-17 ENCOUNTER — Encounter: Payer: Self-pay | Admitting: General Practice

## 2018-09-17 LAB — GLUCOSE TOLERANCE, 2 HOURS W/ 1HR
GLUCOSE, 2 HOUR: 101 mg/dL (ref 65–152)
Glucose, 1 hour: 115 mg/dL (ref 65–179)
Glucose, Fasting: 79 mg/dL (ref 65–91)

## 2018-09-17 LAB — CYSTIC FIBROSIS MUTATION 97: Interpretation: NOT DETECTED

## 2018-09-20 ENCOUNTER — Ambulatory Visit (HOSPITAL_COMMUNITY)
Admission: RE | Admit: 2018-09-20 | Discharge: 2018-09-20 | Disposition: A | Discharge status: Home / Self Care | Payer: Medicaid Other | Source: Ambulatory Visit | Attending: Obstetrics and Gynecology | Admitting: Obstetrics and Gynecology

## 2018-09-20 DIAGNOSIS — Z3480 Encounter for supervision of other normal pregnancy, unspecified trimester: Secondary | ICD-10-CM

## 2018-09-20 DIAGNOSIS — Z363 Encounter for antenatal screening for malformations: Secondary | ICD-10-CM

## 2018-09-20 DIAGNOSIS — O0932 Supervision of pregnancy with insufficient antenatal care, second trimester: Secondary | ICD-10-CM

## 2018-09-20 DIAGNOSIS — O0933 Supervision of pregnancy with insufficient antenatal care, third trimester: Secondary | ICD-10-CM | POA: Insufficient documentation

## 2018-09-20 DIAGNOSIS — Z3A32 32 weeks gestation of pregnancy: Secondary | ICD-10-CM | POA: Diagnosis not present

## 2018-09-20 DIAGNOSIS — Z3483 Encounter for supervision of other normal pregnancy, third trimester: Secondary | ICD-10-CM | POA: Diagnosis present

## 2018-09-25 ENCOUNTER — Other Ambulatory Visit (HOSPITAL_COMMUNITY)
Admission: RE | Admit: 2018-09-25 | Discharge: 2018-09-25 | Disposition: A | Discharge status: Home / Self Care | Payer: Medicaid Other | Source: Ambulatory Visit | Attending: Nurse Practitioner | Admitting: Nurse Practitioner

## 2018-09-25 ENCOUNTER — Ambulatory Visit (INDEPENDENT_AMBULATORY_CARE_PROVIDER_SITE_OTHER): Payer: Medicaid Other | Admitting: Nurse Practitioner

## 2018-09-25 VITALS — BP 115/65 | HR 118 | Wt 173.8 lb

## 2018-09-25 DIAGNOSIS — O0933 Supervision of pregnancy with insufficient antenatal care, third trimester: Secondary | ICD-10-CM

## 2018-09-25 DIAGNOSIS — Z3480 Encounter for supervision of other normal pregnancy, unspecified trimester: Secondary | ICD-10-CM | POA: Insufficient documentation

## 2018-09-25 DIAGNOSIS — Z3483 Encounter for supervision of other normal pregnancy, third trimester: Secondary | ICD-10-CM

## 2018-09-25 MED ORDER — VITAFOL GUMMIES 3.33-0.333-34.8 MG PO CHEW: 3.0000 | CHEWABLE_TABLET | Freq: Every day | ORAL | 11 refills | Status: DC

## 2018-09-25 MED ORDER — ONE-A-DAY WOMENS PRENATAL 1 28-0.8-235 MG PO CAPS: 1.0000 | ORAL_CAPSULE | Freq: Every day | ORAL | 0 refills | Status: DC

## 2018-09-25 NOTE — Patient Instructions (Signed)
Third Trimester of Pregnancy °The third trimester is from week 29 through week 42, months 7 through 9. This trimester is when your unborn baby (fetus) is growing very fast. At the end of the ninth month, the unborn baby is about 20 inches in length. It weighs about 6-10 pounds. °Follow these instructions at home: °· Avoid all smoking, herbs, and alcohol. Avoid drugs not approved by your doctor. °· Do not use any tobacco products, including cigarettes, chewing tobacco, and electronic cigarettes. If you need help quitting, ask your doctor. You may get counseling or other support to help you quit. °· Only take medicine as told by your doctor. Some medicines are safe and some are not during pregnancy. °· Exercise only as told by your doctor. Stop exercising if you start having cramps. °· Eat regular, healthy meals. °· Wear a good support bra if your breasts are tender. °· Do not use hot tubs, steam rooms, or saunas. °· Wear your seat belt when driving. °· Avoid raw meat, uncooked cheese, and liter boxes and soil used by cats. °· Take your prenatal vitamins. °· Take 1500-2000 milligrams of calcium daily starting at the 20th week of pregnancy until you deliver your baby. °· Try taking medicine that helps you poop (stool softener) as needed, and if your doctor approves. Eat more fiber by eating fresh fruit, vegetables, and whole grains. Drink enough fluids to keep your pee (urine) clear or pale yellow. °· Take warm water baths (sitz baths) to soothe pain or discomfort caused by hemorrhoids. Use hemorrhoid cream if your doctor approves. °· If you have puffy, bulging veins (varicose veins), wear support hose. Raise (elevate) your feet for 15 minutes, 3-4 times a day. Limit salt in your diet. °· Avoid heavy lifting, wear low heels, and sit up straight. °· Rest with your legs raised if you have leg cramps or low back pain. °· Visit your dentist if you have not gone during your pregnancy. Use a soft toothbrush to brush your  teeth. Be gentle when you floss. °· You can have sex (intercourse) unless your doctor tells you not to. °· Do not travel far distances unless you must. Only do so with your doctor's approval. °· Take prenatal classes. °· Practice driving to the hospital. °· Pack your hospital bag. °· Prepare the baby's room. °· Go to your doctor visits. °Get help if: °· You are not sure if you are in labor or if your water has broken. °· You are dizzy. °· You have mild cramps or pressure in your lower belly (abdominal). °· You have a nagging pain in your belly area. °· You continue to feel sick to your stomach (nauseous), throw up (vomit), or have watery poop (diarrhea). °· You have bad smelling fluid coming from your vagina. °· You have pain with peeing (urination). °Get help right away if: °· You have a fever. °· You are leaking fluid from your vagina. °· You are spotting or bleeding from your vagina. °· You have severe belly cramping or pain. °· You lose or gain weight rapidly. °· You have trouble catching your breath and have chest pain. °· You notice sudden or extreme puffiness (swelling) of your face, hands, ankles, feet, or legs. °· You have not felt the baby move in over an hour. °· You have severe headaches that do not go away with medicine. °· You have vision changes. °This information is not intended to replace advice given to you by your health care provider. Make   sure you discuss any questions you have with your health care provider. °Document Released: 01/10/2010 Document Revised: 03/23/2016 Document Reviewed: 12/17/2012 °Elsevier Interactive Patient Education © 2017 Elsevier Inc[employer] °Braxton Hicks Contractions °Contractions of the uterus can occur throughout pregnancy, but they are not always a sign that you are in labor. You may have practice contractions called Braxton Hicks contractions. These false labor contractions are sometimes confused with true labor. °What are Braxton Hicks contractions? °Braxton Hicks  contractions are tightening movements that occur in the muscles of the uterus before labor. Unlike true labor contractions, these contractions do not result in opening (dilation) and thinning of the cervix. Toward the end of pregnancy (32-34 weeks), Braxton Hicks contractions can happen more often and may become stronger. These contractions are sometimes difficult to tell apart from true labor because they can be very uncomfortable. You should not feel embarrassed if you go to the hospital with false labor. °Sometimes, the only way to tell if you are in true labor is for your health care provider to look for changes in the cervix. The health care provider will do a physical exam and may monitor your contractions. If you are not in true labor, the exam should show that your cervix is not dilating and your water has not broken. °If there are other health problems associated with your pregnancy, it is completely safe for you to be sent home with false labor. You may continue to have Braxton Hicks contractions until you go into true labor. °How to tell the difference between true labor and false labor °True labor °· Contractions last 30-70 seconds. °· Contractions become very regular. °· Discomfort is usually felt in the top of the uterus, and it spreads to the lower abdomen and low back. °· Contractions do not go away with walking. °· Contractions usually become more intense and increase in frequency. °· The cervix dilates and gets thinner. °False labor °· Contractions are usually shorter and not as strong as true labor contractions. °· Contractions are usually irregular. °· Contractions are often felt in the front of the lower abdomen and in the groin. °· Contractions may go away when you walk around or change positions while lying down. °· Contractions get weaker and are shorter-lasting as time goes on. °· The cervix usually does not dilate or become thin. °Follow these instructions at home: °· Take over-the-counter  and prescription medicines only as told by your health care provider. °· Keep up with your usual exercises and follow other instructions from your health care provider. °· Eat and drink lightly if you think you are going into labor. °· If Braxton Hicks contractions are making you uncomfortable: °? Change your position from lying down or resting to walking, or change from walking to resting. °? Sit and rest in a tub of warm water. °? Drink enough fluid to keep your urine pale yellow. Dehydration may cause these contractions. °? Do slow and deep breathing several times an hour. °· Keep all follow-up prenatal visits as told by your health care provider. This is important. °Contact a health care provider if: °· You have a fever. °· You have continuous pain in your abdomen. °Get help right away if: °· Your contractions become stronger, more regular, and closer together. °· You have fluid leaking or gushing from your vagina. °· You pass blood-tinged mucus (bloody show). °· You have bleeding from your vagina. °· You have low back pain that you never had before. °· You feel your baby’s head   pushing down and causing pelvic pressure. °· Your baby is not moving inside you as much as it used to. °Summary °· Contractions that occur before labor are called Braxton Hicks contractions, false labor, or practice contractions. °· Braxton Hicks contractions are usually shorter, weaker, farther apart, and less regular than true labor contractions. True labor contractions usually become progressively stronger and regular and they become more frequent. °· Manage discomfort from Braxton Hicks contractions by changing position, resting in a warm bath, drinking plenty of water, or practicing deep breathing. °This information is not intended to replace advice given to you by your health care provider. Make sure you discuss any questions you have with your health care provider. °Document Released: 03/01/2017 Document Revised: 03/01/2017  Document Reviewed: 03/01/2017 °Elsevier Interactive Patient Education © 2018 Elsevier Inc. ° °

## 2018-09-25 NOTE — Progress Notes (Signed)
Subjective:   Nichole Patterson is a 27 y.o. G3P2002 at 5763w6d by LMP being seen today for her first obstetrical visit.  Her obstetrical history is significant for late to prenatal care. Patient does intend to breast feed. Pregnancy history fully reviewed.  Patient reports having shortness of breath when cleaning at home. O2 sat 97%.  HISTORY: OB History  Gravida Para Term Preterm AB Living  3 2 2  0 0 2  SAB TAB Ectopic Multiple Live Births  0 0 0 0 2    # Outcome Date GA Lbr Len/2nd Weight Sex Delivery Anes PTL Lv  3 Current           2 Term 10/06/12 4155w0d   F Vag-Breech  N LIV  1 Term 10/09/11 5855w0d   M Vag-Spont  N LIV   Past Medical History:  Diagnosis Date  . Asthma   . Chlamydia   . Headache   . Heart murmur   . Trichomonas contact    Past Surgical History:  Procedure Laterality Date  . MOUTH SURGERY     Family History  Problem Relation Age of Onset  . Pneumonia Father    Social History   Tobacco Use  . Smoking status: Never Smoker  . Smokeless tobacco: Never Used  Substance Use Topics  . Alcohol use: Never    Frequency: Never  . Drug use: Never   Allergies  Allergen Reactions  . Latex Itching    Redness   Current Outpatient Medications on File Prior to Visit  Medication Sig Dispense Refill  . cyclobenzaprine (FLEXERIL) 10 MG tablet Take 1 tablet (10 mg total) by mouth 3 (three) times daily as needed for muscle spasms. 30 tablet 2  . Prenatal Vit-Fe Fumarate-FA (MULTIVITAMIN-PRENATAL) 27-0.8 MG TABS tablet Take 1 tablet by mouth daily at 12 noon.     No current facility-administered medications on file prior to visit.      Exam   Vitals:   09/25/18 1308  BP: 115/65  Pulse: (!) 118  Weight: 173 lb 12.8 oz (78.8 kg)  Repeat HR is 108 Fetal Heart Rate (bpm): 156  Uterus:  Fundal Height: 35 cm  Pelvic Exam: Perineum: no hemorrhoids, normal perineum   Vulva: normal external genitalia, no lesions   Vagina:  normal mucosa, normal discharge   Cervix: no lesions and normal, pap smear done. Small amount of bleeding noted with pap   Adnexa: normal adnexa and no mass, fullness, tenderness   Bony Pelvis: average  System: General: well-developed, well-nourished female in no acute distress   Breast:  normal appearance, no masses or tenderness   Skin: normal coloration and turgor, no rashes   Neurologic: oriented, normal, negative, normal mood   Extremities: normal strength, tone, and muscle mass, ROM of all joints is normal   HEENT extraocular movement intact and sclera clear, anicteric   Mouth/Teeth mucous membranes moist, pharynx normal without lesions and dental hygiene good   Neck supple and no masses, normal thyroid   Cardiovascular: regular rate and rhythm,    Respiratory:  no respiratory distress, normal breath sounds   Abdomen: soft, non-tender; no masses,  no organomegaly     Assessment:   Pregnancy: [id number] Patient Active Problem List   Diagnosis Date Noted  . Supervision of other normal pregnancy, antepartum 07/10/2018     Plan:  1. Supervision of other normal pregnancy, antepartum Reviewed Braxton Hicks contractions.  Advised that full term and expected time of labor is at 6437  weeks or beyond. Advised to rest periodically when cleaning.  - Cytology - PAP( [place])  2. Late prenatal care affecting pregnancy in third trimester Has been seen several times in MAU - notes reviewed.  Initial labs drawn. Continue prenatal vitamins. Genetic Screening discussed, discussed briefly as client is currently at 33 weeks.: declined. Ultrasound discussed; fetal anatomic survey: done on 09-20-18. Problem list reviewed and updated. The nature of [facility] with multiple MDs and other Advanced Practice Providers was explained to patient; also emphasized that residents, students are part of our team. Routine obstetric precautions reviewed. Return in about 2 weeks (around  10/09/2018).  Total face-to-face time with patient: 40 minutes.  Over 50% of encounter was spent on counseling and coordination of care.     Nolene Bernheim, FNP Family Nurse Practitioner, [facility] for [employer], [facility] 09/25/2018 3:30 PM

## 2018-09-30 LAB — CYTOLOGY - PAP
Chlamydia: NEGATIVE
DIAGNOSIS: NEGATIVE
Neisseria Gonorrhea: NEGATIVE

## 2018-10-10 ENCOUNTER — Inpatient Hospital Stay (HOSPITAL_COMMUNITY)
Admission: AD | Admit: 2018-10-10 | Discharge: 2018-10-10 | Disposition: A | Discharge status: Home / Self Care | Payer: Medicaid Other | Attending: Obstetrics & Gynecology | Admitting: Obstetrics & Gynecology

## 2018-10-10 ENCOUNTER — Telehealth: Payer: Self-pay | Admitting: General Practice

## 2018-10-10 ENCOUNTER — Encounter: Payer: Medicaid Other | Admitting: Obstetrics and Gynecology

## 2018-10-10 DIAGNOSIS — Z3689 Encounter for other specified antenatal screening: Secondary | ICD-10-CM

## 2018-10-10 DIAGNOSIS — O26893 Other specified pregnancy related conditions, third trimester: Secondary | ICD-10-CM | POA: Diagnosis not present

## 2018-10-10 DIAGNOSIS — R102 Pelvic and perineal pain: Secondary | ICD-10-CM | POA: Diagnosis not present

## 2018-10-10 DIAGNOSIS — Z3A35 35 weeks gestation of pregnancy: Secondary | ICD-10-CM

## 2018-10-10 DIAGNOSIS — Z3493 Encounter for supervision of normal pregnancy, unspecified, third trimester: Secondary | ICD-10-CM

## 2018-10-10 DIAGNOSIS — Z3480 Encounter for supervision of other normal pregnancy, unspecified trimester: Secondary | ICD-10-CM

## 2018-10-10 DIAGNOSIS — O0930 Supervision of pregnancy with insufficient antenatal care, unspecified trimester: Secondary | ICD-10-CM | POA: Insufficient documentation

## 2018-10-10 LAB — URINALYSIS, ROUTINE W REFLEX MICROSCOPIC
BILIRUBIN URINE: NEGATIVE
GLUCOSE, UA: NEGATIVE mg/dL
HGB URINE DIPSTICK: NEGATIVE
KETONES UR: 80 mg/dL — AB
LEUKOCYTES UA: NEGATIVE
NITRITE: NEGATIVE
Protein, ur: NEGATIVE mg/dL
Specific Gravity, Urine: 1.024 (ref 1.005–1.030)
pH: 6 (ref 5.0–8.0)

## 2018-10-10 MED ORDER — COMFORT FIT MATERNITY SUPP MED MISC: 1.0000 | Freq: Every day | 0 refills | Status: DC

## 2018-10-10 NOTE — MAU Provider Note (Signed)
History     CSN: [id number]  Arrival date and time: 10/10/18 1024   Chief Complaint  Patient presents with  . Pelvic Pain   G3P2002 @35 .0 wks here with pelvic pain and pressure. Reports sx started this am. Feels like she needs to push. Reports good FM. No VB, LOF, or ctx. Her pregnancy is complicated by late prenatal care.   OB History    Gravida  3   Para  2   Term  2   Preterm  0   AB  0   Living  2     SAB      TAB      Ectopic      Multiple      Live Births  2           Past Medical History:  Diagnosis Date  . Asthma   . Chlamydia   . Headache   . Heart murmur   . Trichomonas contact     Past Surgical History:  Procedure Laterality Date  . MOUTH SURGERY      Family History  Problem Relation Age of Onset  . Pneumonia Father     Social History   Tobacco Use  . Smoking status: Never Smoker  . Smokeless tobacco: Never Used  Substance Use Topics  . Alcohol use: Never    Frequency: Never  . Drug use: Never    Allergies:  Allergies  Allergen Reactions  . Latex Itching    Redness    Medications Prior to Admission  Medication Sig Dispense Refill Last Dose  . cyclobenzaprine (FLEXERIL) 10 MG tablet Take 1 tablet (10 mg total) by mouth 3 (three) times daily as needed for muscle spasms. 30 tablet 2 Taking  . Prenat-Fe Carbonyl-FA-Omega 3 (ONE-A-DAY WOMENS PRENATAL 1) 28-0.8-235 MG CAPS Take 1 tablet by mouth daily. 20 capsule 0   . Prenatal Vit-Fe Fumarate-FA (MULTIVITAMIN-PRENATAL) 27-0.8 MG TABS tablet Take 1 tablet by mouth daily at 12 noon.   Not Taking  . Prenatal Vit-Fe Phos-FA-Omega (VITAFOL GUMMIES) 3.33-0.333-34.8 MG CHEW Chew 3 each by mouth daily. 90 tablet 11     Review of Systems  Gastrointestinal: Negative for abdominal pain.  Genitourinary: Positive for pelvic pain. Negative for vaginal bleeding.   Physical Exam   Blood pressure 124/73, pulse (!) 110, last menstrual period 02/07/2018.  Physical Exam   Constitutional: She is oriented to person, place, and time. She appears well-developed and well-nourished. She appears distressed.  HENT:  Head: Normocephalic and atraumatic.  Neck: Normal range of motion.  Respiratory: Effort normal. No respiratory distress.  GI: Soft. She exhibits no distension. There is no abdominal tenderness.  gravid  Genitourinary:    Genitourinary Comments: SVE FT/thick   Musculoskeletal: Normal range of motion.  Neurological: She is alert and oriented to person, place, and time.  Skin: Skin is warm and dry.  Psychiatric: She has a normal mood and affect.  EFM: 135 bpm, mod variability, + accels, no decels Toco: rare  Results for orders placed or performed during the hospital encounter of 10/10/18 (from the past 24 hour(s))  Urinalysis, Routine w reflex microscopic     Status: Abnormal   Collection Time: 10/10/18 11:42 AM  Result Value Ref Range   Color, Urine YELLOW YELLOW   APPearance CLEAR CLEAR   Specific Gravity, Urine 1.024 1.005 - 1.030   pH 6.0 5.0 - 8.0   Glucose, UA NEGATIVE NEGATIVE mg/dL   Hgb urine dipstick NEGATIVE NEGATIVE  Bilirubin Urine NEGATIVE NEGATIVE   Ketones, ur 80 (A) NEGATIVE mg/dL   Protein, ur NEGATIVE NEGATIVE mg/dL   Nitrite NEGATIVE NEGATIVE   Leukocytes, UA NEGATIVE NEGATIVE   MAU Course  Procedures  MDM Labs ordered and reviewed. No evidence of PTL or UTI. Pain likely d/t fetal engagement. Discussed comfort measures. Stable for discharge home.   Assessment and Plan   1. [redacted] weeks gestation of pregnancy   2. Supervision of other normal pregnancy, antepartum   3. NST (non-stress test) reactive   4. Lightening of fetus during pregnancy in third trimester    Discharge home Follow up in OB office next week PTL precautions Rx maternity belt  Allergies as of 10/10/2018      Reactions   Latex Itching   Redness      Medication List    STOP taking these medications   ONE-A-DAY WOMENS PRENATAL 1 28-0.8-235 MG  Caps   VITAFOL GUMMIES 3.33-0.333-34.8 MG Chew     TAKE these medications   COMFORT FIT MATERNITY SUPP MED Misc 1 Device by Does not apply route daily.   cyclobenzaprine 10 MG tablet Commonly known as:  FLEXERIL Take 1 tablet (10 mg total) by mouth 3 (three) times daily as needed for muscle spasms.   multivitamin-prenatal 27-0.8 MG Tabs tablet Take 1 tablet by mouth daily at 12 noon.      Donette LarryMelanie Tashai Catino, CNM 10/10/2018, 10:54 AM

## 2018-10-10 NOTE — Telephone Encounter (Signed)
Patient called (10:00am) and stated that she is in a lot of pain and spotting.  She is [redacted]w[redacted]d today.  32Dorisann Framesamika Martin, RN advised patient to go to [facility]U to be evaluated.  Explained to patient that we would cancel her appointment for today at 2:30pm and to keep appointment that is scheduled on 10/24/18 at 4:10pm unless provider states otherwise.  Patient verbalized understanding.

## 2018-10-10 NOTE — [facility]U Note (Signed)
Pt reports vaginal pressure and pain since 0930 am, denies contractions.

## 2018-10-10 NOTE — Discharge Instructions (Signed)

## 2018-10-17 ENCOUNTER — Ambulatory Visit (INDEPENDENT_AMBULATORY_CARE_PROVIDER_SITE_OTHER): Payer: Medicaid Other | Admitting: Obstetrics and Gynecology

## 2018-10-17 ENCOUNTER — Other Ambulatory Visit (HOSPITAL_COMMUNITY)
Admission: RE | Admit: 2018-10-17 | Discharge: 2018-10-17 | Disposition: A | Discharge status: Home / Self Care | Payer: Medicaid Other | Source: Ambulatory Visit | Attending: Obstetrics and Gynecology | Admitting: Obstetrics and Gynecology

## 2018-10-17 VITALS — BP 121/71 | HR 101 | Wt 174.0 lb

## 2018-10-17 DIAGNOSIS — Z3480 Encounter for supervision of other normal pregnancy, unspecified trimester: Secondary | ICD-10-CM | POA: Diagnosis present

## 2018-10-17 DIAGNOSIS — Z3483 Encounter for supervision of other normal pregnancy, third trimester: Secondary | ICD-10-CM

## 2018-10-17 LAB — OB RESULTS CONSOLE GC/CHLAMYDIA: GC PROBE AMP, GENITAL: NEGATIVE

## 2018-10-17 NOTE — Progress Notes (Addendum)
   PRENATAL VISIT NOTE  Subjective:  Nichole Patterson is a 27 y.o. G3P2002 at 4235w0d being seen today for ongoing prenatal care.  She is currently monitored for the following issues for this low-risk pregnancy and has Supervision of other normal pregnancy, antepartum and Late prenatal care on their problem list.  Patient reports and swelling in hands, feet, and ankles, no bleeding or cramping.  Contractions: Irregular. Vag. Bleeding: None.  Movement: Present. Denies leaking of fluid.   The following portions of the patient's history were reviewed and updated as appropriate: allergies, current medications, past family history, past medical history, past social history, past surgical history and problem list. Problem list updated.  Objective:   Vitals:   10/17/18 1027  BP: 121/71  Pulse: (!) 101  Weight: 78.9 kg    Fetal Status: Fetal Heart Rate (bpm): 156 Fundal Height: 36 cm Movement: Present     General:  Alert, oriented and cooperative. Patient is in no acute distress.  Skin: Skin is warm and dry. No rash noted.   Cardiovascular: Normal heart rate noted  Respiratory: Normal respiratory effort, no problems with respiration noted  Abdomen: Soft, gravid, appropriate for gestational age.  Pain/Pressure: Present     Pelvic: Cervical exam deferred        Extremities: Normal range of motion.  Edema: Trace  Mental Status: Normal mood and affect. Normal behavior. Normal judgment and thought content.   Assessment and Plan:  Pregnancy: G3P2002 at 4635w0d  1. Supervision of other normal pregnancy, antepartum - Encouraged to use maternity belt and elevate legs at night - Cervicovaginal ancillary only( [facility]) - Culture, beta strep (group b only)  Preterm labor symptoms and general obstetric precautions including but not limited to vaginal bleeding, contractions, leaking of fluid and fetal movement were reviewed in detail with the patient. Please refer to After Visit Summary for other  counseling recommendations.   Future Appointments  Date Time Provider Department Center  10/24/2018  4:10 PM Raelyn Moraawson, Rolitta, CNM [facility] None  10/31/2018  1:10 PM Raelyn Moraawson, Rolitta, CNM [facility] None  11/07/2018  1:10 PM Raelyn Moraawson, Rolitta, CNM [facility] None  11/14/2018  1:10 PM Raelyn Moraawson, Rolitta, CNM [facility] None    Bernerd LimboJamilla R Jaylei Fuerte, Student-MidWife

## 2018-10-18 LAB — CERVICOVAGINAL ANCILLARY ONLY
Chlamydia: NEGATIVE
Neisseria Gonorrhea: NEGATIVE

## 2018-10-21 LAB — CULTURE, BETA STREP (GROUP B ONLY): Strep Gp B Culture: NEGATIVE

## 2018-10-21 LAB — OB RESULTS CONSOLE GBS: GBS: NEGATIVE

## 2018-10-24 ENCOUNTER — Encounter: Payer: Medicaid Other | Admitting: Obstetrics and Gynecology

## 2018-10-31 ENCOUNTER — Encounter: Payer: Self-pay | Admitting: General Practice

## 2018-10-31 ENCOUNTER — Ambulatory Visit (INDEPENDENT_AMBULATORY_CARE_PROVIDER_SITE_OTHER): Payer: Medicaid Other | Admitting: Obstetrics and Gynecology

## 2018-10-31 VITALS — BP 113/73 | HR 106 | Wt 176.4 lb

## 2018-10-31 DIAGNOSIS — Z3480 Encounter for supervision of other normal pregnancy, unspecified trimester: Secondary | ICD-10-CM

## 2018-10-31 DIAGNOSIS — Z3483 Encounter for supervision of other normal pregnancy, third trimester: Secondary | ICD-10-CM

## 2018-10-31 NOTE — Progress Notes (Addendum)
   PRENATAL VISIT NOTE  Subjective:  Nichole Patterson is a 28 y.o. G3P2002 at 6863w0d being seen today for ongoing prenatal care.  She is currently monitored for the following issues for this low-risk pregnancy and has Supervision of other normal pregnancy, antepartum and Late prenatal care on their problem list.  Patient reports tightness at night, some pelvic pressure, swelling and pain in hands and feet in the mornings, headaches.  Contractions: Irregular. Vag. Bleeding: None.  Movement: Present. Denies leaking of fluid. Headaches relieved when she goes to sleep, has not tried taking Tylenol. Swelling worst in the morning, but gets better throughout the day as she moves around.   The following portions of the patient's history were reviewed and updated as appropriate: allergies, current medications, past family history, past medical history, past social history, past surgical history and problem list. Problem list updated.  Objective:   Vitals:   10/31/18 1304  BP: 113/73  Pulse: (!) 106  Weight: 80 kg    Fetal Status: Fetal Heart Rate (bpm): 151 Fundal Height: 38 cm Movement: Present  Presentation: Vertex  General:  Alert, oriented and cooperative. Patient is in no acute distress.  Skin: Skin is warm and dry. No rash noted.   Cardiovascular: Normal heart rate noted  Respiratory: Normal respiratory effort, no problems with respiration noted  Abdomen: Soft, gravid, appropriate for gestational age.  Pain/Pressure: Present     Pelvic: Cervical exam performed Dilation: Fingertip Effacement (%): 50 Station: Ballotable  Extremities: Normal range of motion.  Edema: Trace  Mental Status: Normal mood and affect. Normal behavior. Normal judgment and thought content.   Assessment and Plan:  Pregnancy: G3P2002 at 6063w0d  1. Supervision of other normal pregnancy, antepartum - Encouraged her to drink more water, take Tylenol for headaches and call if headaches are severe and unrelieved by Tylenol. -  Discussed normal end of pregnancy symptoms.  Term labor symptoms and general obstetric precautions including but not limited to vaginal bleeding, contractions, leaking of fluid and fetal movement were reviewed in detail with the patient. Please refer to After Visit Summary for other counseling recommendations.  Return in about 1 week (around 11/07/2018) for ROB.  Future Appointments  Date Time Provider Department Center  11/07/2018  1:10 PM Raelyn Moraawson, Rolitta, CNM [facility] None  11/14/2018  1:10 PM Raelyn Moraawson, Rolitta, CNM [facility] None    Bernerd LimboJamilla R Shawneequa Baldridge, Student-MidWife

## 2018-11-07 ENCOUNTER — Ambulatory Visit (INDEPENDENT_AMBULATORY_CARE_PROVIDER_SITE_OTHER): Payer: Medicaid Other | Admitting: Obstetrics and Gynecology

## 2018-11-07 VITALS — BP 117/68 | HR 99 | Wt 180.6 lb

## 2018-11-07 DIAGNOSIS — Z3403 Encounter for supervision of normal first pregnancy, third trimester: Secondary | ICD-10-CM

## 2018-11-07 NOTE — Progress Notes (Signed)
   PRENATAL VISIT NOTE  Subjective:  Nichole Patterson is a 28 y.o. G3P2002 at [redacted]w[redacted]d being seen today for ongoing prenatal care.  She is currently monitored for the following issues for this low-risk pregnancy and has Supervision of other normal pregnancy, antepartum and Late prenatal care on their problem list.  Patient reports no complaints.  Contractions: Irregular. Vag. Bleeding: None.  Movement: Present. Denies leaking of fluid. Eager to be done with pregnancy, requested IOL.  The following portions of the patient's history were reviewed and updated as appropriate: allergies, current medications, past family history, past medical history, past social history, past surgical history and problem list. Problem list updated.  Objective:   Vitals:   11/07/18 1306  BP: 117/68  Pulse: 99  Weight: 81.9 kg    Fetal Status: Fetal Heart Rate (bpm): 153 Fundal Height: 39 cm Movement: Present     General:  Alert, oriented and cooperative. Patient is in no acute distress.  Skin: Skin is warm and dry. No rash noted.   Cardiovascular: Normal heart rate noted  Respiratory: Normal respiratory effort, no problems with respiration noted  Abdomen: Soft, gravid, appropriate for gestational age.  Pain/Pressure: Present     Pelvic: Cervical exam deferred        Extremities: Normal range of motion.  Edema: Trace  Mental Status: Normal mood and affect. Normal behavior. Normal judgment and thought content.   Assessment and Plan:  Pregnancy: G3P2002 at [redacted]w[redacted]d  1. Encounter for supervision of normal first pregnancy in third trimester - Discussed reasons for induction of labor, encouraged her to keep walking and anticipate SOL.  Term labor symptoms and general obstetric precautions including but not limited to vaginal bleeding, contractions, leaking of fluid and fetal movement were reviewed in detail with the patient. Please refer to After Visit Summary for other counseling recommendations.  Return in about 1  week (around 11/14/2018) for Return OB visit.  Future Appointments  Date Time Provider Department Center  11/14/2018  1:10 PM Raelyn Mora, CNM [facility] None    Bernerd Limbo, Student-MidWife

## 2018-11-07 NOTE — Patient Instructions (Signed)
Braxton Hicks Contractions Contractions of the uterus can occur throughout pregnancy, but they are not always a sign that you are in labor. You may have practice contractions called Braxton Hicks contractions. These false labor contractions are sometimes confused with true labor. What are Braxton Hicks contractions? Braxton Hicks contractions are tightening movements that occur in the muscles of the uterus before labor. Unlike true labor contractions, these contractions do not result in opening (dilation) and thinning of the cervix. Toward the end of pregnancy (32-34 weeks), Braxton Hicks contractions can happen more often and may become stronger. These contractions are sometimes difficult to tell apart from true labor because they can be very uncomfortable. You should not feel embarrassed if you go to the hospital with false labor. Sometimes, the only way to tell if you are in true labor is for your health care provider to look for changes in the cervix. The health care provider will do a physical exam and may monitor your contractions. If you are not in true labor, the exam should show that your cervix is not dilating and your water has not broken. If there are no other health problems associated with your pregnancy, it is completely safe for you to be sent home with false labor. You may continue to have Braxton Hicks contractions until you go into true labor. How to tell the difference between true labor and false labor True labor  Contractions last 30-70 seconds.  Contractions become very regular.  Discomfort is usually felt in the top of the uterus, and it spreads to the lower abdomen and low back.  Contractions do not go away with walking.  Contractions usually become more intense and increase in frequency.  The cervix dilates and gets thinner. False labor  Contractions are usually shorter and not as strong as true labor contractions.  Contractions are usually irregular.  Contractions  are often felt in the front of the lower abdomen and in the groin.  Contractions may go away when you walk around or change positions while lying down.  Contractions get weaker and are shorter-lasting as time goes on.  The cervix usually does not dilate or become thin. Follow these instructions at home:   Take over-the-counter and prescription medicines only as told by your health care provider.  Keep up with your usual exercises and follow other instructions from your health care provider.  Eat and drink lightly if you think you are going into labor.  If Braxton Hicks contractions are making you uncomfortable: ? Change your position from lying down or resting to walking, or change from walking to resting. ? Sit and rest in a tub of warm water. ? Drink enough fluid to keep your urine pale yellow. Dehydration may cause these contractions. ? Do slow and deep breathing several times an hour.  Keep all follow-up prenatal visits as told by your health care provider. This is important. Contact a health care provider if:  You have a fever.  You have continuous pain in your abdomen. Get help right away if:  Your contractions become stronger, more regular, and closer together.  You have fluid leaking or gushing from your vagina.  You pass blood-tinged mucus (bloody show).  You have bleeding from your vagina.  You have low back pain that you never had before.  You feel your baby's head pushing down and causing pelvic pressure.  Your baby is not moving inside you as much as it used to. Summary  Contractions that occur before labor are   called Braxton Hicks contractions, false labor, or practice contractions.  Braxton Hicks contractions are usually shorter, weaker, farther apart, and less regular than true labor contractions. True labor contractions usually become progressively stronger and regular, and they become more frequent.  Manage discomfort from Braxton Hicks contractions  by changing position, resting in a warm bath, drinking plenty of water, or practicing deep breathing. This information is not intended to replace advice given to you by your health care provider. Make sure you discuss any questions you have with your health care provider. Document Released: 03/01/2017 Document Revised: 07/31/2017 Document Reviewed: 03/01/2017 Elsevier Interactive Patient Education  2019 Elsevier Inc.  

## 2018-11-14 ENCOUNTER — Ambulatory Visit (INDEPENDENT_AMBULATORY_CARE_PROVIDER_SITE_OTHER): Payer: Medicaid Other | Admitting: Obstetrics and Gynecology

## 2018-11-14 ENCOUNTER — Encounter: Payer: Self-pay | Admitting: General Practice

## 2018-11-14 VITALS — BP 130/66 | HR 95 | Wt 181.2 lb

## 2018-11-14 DIAGNOSIS — Z3483 Encounter for supervision of other normal pregnancy, third trimester: Secondary | ICD-10-CM

## 2018-11-14 NOTE — Progress Notes (Signed)
   PRENATAL VISIT NOTE  Subjective:  Nichole Patterson is a 28 y.o. G3P2002 at [redacted]w[redacted]d being seen today for ongoing prenatal care.  She is currently monitored for the following issues for this low-risk pregnancy and has Supervision of other normal pregnancy, antepartum and Late prenatal care on their problem list.  Patient reports no complaints.  Contractions: Irregular. Vag. Bleeding: None.  Movement: Present. Denies leaking of fluid. Wanted to discuss induction options.  The following portions of the patient's history were reviewed and updated as appropriate: allergies, current medications, past family history, past medical history, past social history, past surgical history and problem list. Problem list updated.  Objective:   Vitals:   11/14/18 1306  BP: 130/66  Pulse: 95  Weight: 82.2 kg    Fetal Status: Fetal Heart Rate (bpm): 155 Fundal Height: 40 cm Movement: Present  Presentation: Vertex  General:  Alert, oriented and cooperative. Patient is in no acute distress.  Skin: Skin is warm and dry. No rash noted.   Cardiovascular: Normal heart rate noted  Respiratory: Normal respiratory effort, no problems with respiration noted  Abdomen: Soft, gravid, appropriate for gestational age.  Pain/Pressure: Present     Pelvic: Cervical exam performed Dilation: Closed Effacement (%): 60 Station: Ballotable  Attempted to strip membranes, unable due to closed and posterior cervix.  Extremities: Normal range of motion.  Edema: Trace  Mental Status: Normal mood and affect. Normal behavior. Normal judgment and thought content.   Assessment and Plan:  Pregnancy: G3P2002 at [redacted]w[redacted]d  1. Encounter for supervision of other normal pregnancy in third trimester - Induction scheduled for 11/21/18  Term labor symptoms and general obstetric precautions including but not limited to vaginal bleeding, contractions, leaking of fluid and fetal movement were reviewed in detail with the patient. Please refer to After  Visit Summary for other counseling recommendations.  Return in about 6 weeks (around 12/26/2018) for Postpartum Visit.  Future Appointments  Date Time Provider Department Center  11/21/2018  7:30 AM [facility] None    Bernerd Limbo, Student-MidWife

## 2018-11-14 NOTE — Patient Instructions (Addendum)
YOU ARE SCHEDULED TO BE INDUCED ON THURSDAY 11/21/2018 AT 7:30 AM. ARRIVE NO LATER THAN 7:15 AM.  Augmentation of Labor  Augmentation of labor is when steps are taken to stimulate and strengthen contractions of the uterus during labor. This may be done when contractions have slowed down or stopped, delaying progress of labor and delivery of the baby. Before beginning augmentation of labor, your health care provider will evaluate your condition, your baby's condition, the size and position of your baby, and the size of your birth canal. What are some reasons for labor augmentation? Augmentation of labor may be needed when:  You are in labor but your contractions are weak or irregular.  You are in labor but your contractions have stopped. What methods are used for labor augmentation? Labor augmentation may be done by:  Giving medicine that stimulates contractions (oxytocin). This is given through an IV tube that is inserted into one of your veins.  Breaking the fluid-filled sac that surrounds the fetus (amniotic sac). What are the risks associated with labor augmentation? Some risks of labor augmentation include:  Too much stimulation of the contractions, resulting in continuous, prolonged, or very strong contractions.  Increased risk of infection for you and your baby.  Tearing (rupture) of the uterus.  Breaking off (abruption) of the placenta.  Increased risk of cesarean, forceps, or vacuum delivery.  Excessive bleeding after delivery (postpartum hemorrhage).  Death of the baby (fetal death). What are some reasons for not doing labor augmentation? Augmentation of labor should not be done if:  The baby is too big for the birth canal. This can be confirmed with an ultrasound.  The umbilical cord drops in front of the baby's head or breech part (prolapsed cord).  You have had a cesarean delivery and you had a vertical incision or you do not know what type of incision you  had.  You have had surgery on or into your uterus.  You have an active herpes outbreak.  You have cervical cancer.  The placenta blocks the opening of the cervix (placenta previa) or you have other condition that is blocking the cervix or vaginal outlet.  The baby is lying sideways.  Your pelvis is will not permit the passage of the baby.  You are carrying more than two babies. Summary  Augmentation of labor is when steps are taken to stimulate and strengthen contractions of the uterus during labor. This may be done when contractions have slowed down or stopped, delaying progress of labor and delivery of the baby.  Labor augmentation may be done using medicine to stimulate contractions (oxytocin) or by breaking the fluid-filled sac that surrounds the fetus (amniotic sac).  Labor should not be augmented if you have had a cesarean delivery and you had a vertical incision or you do not know what type of incision you had. This information is not intended to replace advice given to you by your health care provider. Make sure you discuss any questions you have with your health care provider. Document Released: 04/10/2007 Document Revised: 11/20/2016 Document Reviewed: 11/20/2016 Elsevier Interactive Patient Education  2019 [employer]   Early Elective Birth  Early elective birth refers to making a choice to have a baby before the time the baby is due. Pregnancy is considered full term from 39 weeks through 40 weeks and 6 days. Early elective births can take place after 39 weeks of gestation. Most health care providers practice within the guidelines of delivering a baby no later than  42 weeks of pregnancy (gestation) and no earlier than 39 weeks. Generally, the longer the baby stays inside the uterus to reach full term, the lower the risks are to both the baby and the mother. In some cases, labor may be induced before 39 weeks due to health risks to the mother or baby. Induction of labor  refers to the use of medicines to bring about contractions and labor in a pregnant woman. Medical reasons for having early birth It may be safer to induce labor before 39 weeks of gestation if:  A woman is carrying more than 1 baby. Twins may be delivered at 38 weeks of gestation.  A woman is having complications during pregnancy, such as: ? High blood pressure caused by pregnancy (preeclampsia). ? Bleeding. ? Infection.  There are conditions affecting the baby's health, such as: ? Intrauterine growth restriction (IUGR), in which something is preventing the baby from growing well. ? Abnormal fetal heart rate. ? Lack of fluid that surrounds the baby (oligohydramnios). ? Problems with the placenta.  Fluid that surrounds the baby (amniotic fluid) is leaking. There are many other medical reasons why you may need to have early birth. You and your health care provider will discuss your risks to determine whether there is a medical reason for you to deliver your baby early. Reasons to avoid early elective birth Sometimes early birth is not the best choice. It may not be a good idea if:  It is chosen just for convenience.  You want the baby to be born on a certain date, like a holiday. You are more likely to need a cesarean delivery before 39 weeks of gestation. Having a cesarean section may cause problems such as:  Infection.  Bleeding.  Longer recovery time. Babies born early term (between 75 and 38 weeks and 6 days gestation):  May need special care in the nursery or in a neonatal intensive care unit (NICU).  Have a greater risk for: ? Infection. ? Brain damage or bleeding inside the brain. ? Feeding problems. ? Breathing problems. ? Problems controlling their temperature. ? Slow physical and mental development. ? Dying during their first year of life. General tips for a healthy pregnancy Good prenatal care reduces the risk of problems for you and your baby, and may help you  to avoid preterm birth. During your pregnancy, do the following:  Take a prenatal vitamin containing folic acid. Take any additional vitamins as recommended by your health care provider, such as calcium supplements.  Make sure you are up to date on your vaccinations.  Eat a healthy diet that includes: ? Fresh fruits and vegetables. ? Lean proteins. ? Calcium-rich foods, such as milk, yogurt, hard cheeses, and dark, leafy greens. ? Whole grains.  Follow instructions from your health care provider about managing any medical conditions you may have during your pregnancy.  Drink enough water to keep your urine clear or pale yellow. For many women, this may be 10 or more 8-oz glasses of water each day. Keeping yourself hydrated may help prevent preterm contractions.  Do not use alcohol, tobacco products, or drugs. This includes cigarettes and e-cigarettes. If you need help quitting, ask your health care provider.  Exercise regularly and get plenty of rest. Unless told otherwise by your health care provider, try to get 30 minutes of moderate exercise most days of the week.  Keep all prenatal visits as told by your health care provider. This is important. Summary  A full term pregnancy is  usually best for baby and mother. In some cases, early elective birth may be necessary due to health risks to mother or baby.  Generally, the longer the baby stays inside the uterus until it is full term, the lower the risks are to both the baby and the mother.  An early elective birth may lead to a cesarean delivery. This can lead to other problems for the mother and baby.  Keep all prenatal visits as told by your health care provider. This is important. Good prenatal care reduces the risk of problems for you and your baby, and may help you avoid preterm birth. This information is not intended to replace advice given to you by your health care provider. Make sure you discuss any questions you have with your  health care provider. Document Released: 06/28/2011 Document Revised: 11/20/2016 Document Reviewed: 11/20/2016 Elsevier Interactive Patient Education  2019 [employer]

## 2018-11-15 ENCOUNTER — Telehealth (HOSPITAL_COMMUNITY): Payer: Self-pay | Admitting: *Deleted

## 2018-11-15 NOTE — Telephone Encounter (Signed)
Preadmission screen  

## 2018-11-17 ENCOUNTER — Inpatient Hospital Stay (HOSPITAL_COMMUNITY)
Admission: AD | Admit: 2018-11-17 | Discharge: 2018-11-21 | DRG: 787 | Disposition: A | Discharge status: Home / Self Care | Payer: Medicaid Other | Attending: Obstetrics and Gynecology | Admitting: Obstetrics and Gynecology

## 2018-11-17 ENCOUNTER — Other Ambulatory Visit: Payer: Self-pay

## 2018-11-17 ENCOUNTER — Encounter (HOSPITAL_COMMUNITY): Payer: Self-pay | Admitting: *Deleted

## 2018-11-17 DIAGNOSIS — N179 Acute kidney failure, unspecified: Secondary | ICD-10-CM | POA: Diagnosis not present

## 2018-11-17 DIAGNOSIS — Z3A40 40 weeks gestation of pregnancy: Secondary | ICD-10-CM

## 2018-11-17 DIAGNOSIS — R030 Elevated blood-pressure reading, without diagnosis of hypertension: Secondary | ICD-10-CM

## 2018-11-17 DIAGNOSIS — O26833 Pregnancy related renal disease, third trimester: Secondary | ICD-10-CM | POA: Diagnosis not present

## 2018-11-17 DIAGNOSIS — D649 Anemia, unspecified: Secondary | ICD-10-CM | POA: Diagnosis present

## 2018-11-17 DIAGNOSIS — O0930 Supervision of pregnancy with insufficient antenatal care, unspecified trimester: Secondary | ICD-10-CM

## 2018-11-17 DIAGNOSIS — O368130 Decreased fetal movements, third trimester, not applicable or unspecified: Principal | ICD-10-CM

## 2018-11-17 DIAGNOSIS — O368190 Decreased fetal movements, unspecified trimester, not applicable or unspecified: Secondary | ICD-10-CM | POA: Diagnosis present

## 2018-11-17 DIAGNOSIS — O9902 Anemia complicating childbirth: Secondary | ICD-10-CM | POA: Diagnosis present

## 2018-11-17 DIAGNOSIS — Z590 Homelessness: Secondary | ICD-10-CM | POA: Diagnosis not present

## 2018-11-17 DIAGNOSIS — Z3480 Encounter for supervision of other normal pregnancy, unspecified trimester: Secondary | ICD-10-CM

## 2018-11-17 DIAGNOSIS — O480 Post-term pregnancy: Secondary | ICD-10-CM | POA: Diagnosis present

## 2018-11-17 LAB — URINALYSIS, ROUTINE W REFLEX MICROSCOPIC
Bilirubin Urine: NEGATIVE
Glucose, UA: NEGATIVE mg/dL
HGB URINE DIPSTICK: NEGATIVE
Ketones, ur: NEGATIVE mg/dL
NITRITE: NEGATIVE
Protein, ur: NEGATIVE mg/dL
Specific Gravity, Urine: 1.024 (ref 1.005–1.030)
pH: 6 (ref 5.0–8.0)

## 2018-11-17 LAB — COMPREHENSIVE METABOLIC PANEL
ALT: 16 U/L (ref 0–44)
AST: 21 U/L (ref 15–41)
Albumin: 3 g/dL — ABNORMAL LOW (ref 3.5–5.0)
Alkaline Phosphatase: 152 U/L — ABNORMAL HIGH (ref 38–126)
Anion gap: 8 (ref 5–15)
BILIRUBIN TOTAL: 0.4 mg/dL (ref 0.3–1.2)
BUN: 10 mg/dL (ref 6–20)
CO2: 20 mmol/L — ABNORMAL LOW (ref 22–32)
Calcium: 8.9 mg/dL (ref 8.9–10.3)
Chloride: 107 mmol/L (ref 98–111)
Creatinine, Ser: 0.75 mg/dL (ref 0.44–1.00)
GFR calc Af Amer: >60 mL/min (ref >60)
GFR calc non Af Amer: >60 mL/min (ref >60)
Glucose, Bld: 112 mg/dL — ABNORMAL HIGH (ref 70–99)
Potassium: 3.8 mmol/L (ref 3.5–5.1)
Sodium: 135 mmol/L (ref 135–145)
Total Protein: 6.1 g/dL — ABNORMAL LOW (ref 6.5–8.1)

## 2018-11-17 LAB — TYPE AND SCREEN
ABO/RH(D): A POS
Antibody Screen: NEGATIVE

## 2018-11-17 LAB — CBC WITH DIFFERENTIAL/PLATELET
Basophils Absolute: 0 10*3/uL (ref 0.0–0.1)
Basophils Relative: 0 %
Eosinophils Absolute: 0.1 10*3/uL (ref 0.0–0.5)
Eosinophils Relative: 1 %
HCT: 30.1 % — ABNORMAL LOW (ref 36.0–46.0)
Hemoglobin: 9.2 g/dL — ABNORMAL LOW (ref 12.0–15.0)
LYMPHS PCT: 24 %
Lymphs Abs: 3.1 10*3/uL (ref 0.7–4.0)
MCH: 25 pg — ABNORMAL LOW (ref 26.0–34.0)
MCHC: 30.6 g/dL (ref 30.0–36.0)
MCV: 81.8 fL (ref 80.0–100.0)
MONO ABS: 0.4 10*3/uL (ref 0.1–1.0)
Monocytes Relative: 3 %
NEUTROS ABS: 9.7 10*3/uL — AB (ref 1.7–7.7)
Neutrophils Relative %: 72 %
Platelets: 254 10*3/uL (ref 150–400)
RBC: 3.68 MIL/uL — AB (ref 3.87–5.11)
RDW: 14.3 % (ref 11.5–15.5)
WBC: 13.3 10*3/uL — ABNORMAL HIGH (ref 4.0–10.5)
nRBC: 0 % (ref 0.0–0.2)

## 2018-11-17 LAB — PROTEIN / CREATININE RATIO, URINE
Creatinine, Urine: 205 mg/dL
Protein Creatinine Ratio: 0.1 mg/mg{Cre} (ref 0.00–0.15)
Total Protein, Urine: 20 mg/dL

## 2018-11-17 MED ORDER — ACETAMINOPHEN 325 MG PO TABS
650.0000 mg | ORAL_TABLET | ORAL | Status: DC | PRN
  Administered 2018-11-17 (×2): 650 mg via ORAL
  Filled 2018-11-17 (×2): qty 2

## 2018-11-17 MED ORDER — LACTATED RINGERS IV SOLN
INTRAVENOUS | Status: DC
  Administered 2018-11-17 (×2): via INTRAVENOUS

## 2018-11-17 MED ORDER — FENTANYL CITRATE (PF) 100 MCG/2ML IJ SOLN
INTRAMUSCULAR | Status: AC
  Administered 2018-11-17: 100 ug via INTRAVENOUS
  Filled 2018-11-17: qty 2

## 2018-11-17 MED ORDER — SOD CITRATE-CITRIC ACID 500-334 MG/5ML PO SOLN
30.0000 mL | ORAL | Status: DC | PRN
  Filled 2018-11-17 (×2): qty 15

## 2018-11-17 MED ORDER — OXYCODONE-ACETAMINOPHEN 5-325 MG PO TABS: 1.0000 | ORAL_TABLET | ORAL | Status: DC | PRN

## 2018-11-17 MED ORDER — FENTANYL CITRATE (PF) 100 MCG/2ML IJ SOLN
100.0000 ug | INTRAMUSCULAR | Status: DC | PRN
  Administered 2018-11-17 – 2018-11-18 (×2): 100 ug via INTRAVENOUS
  Filled 2018-11-17: qty 2

## 2018-11-17 MED ORDER — LACTATED RINGERS IV SOLN
500.0000 mL | INTRAVENOUS | Status: DC | PRN
  Administered 2018-11-18: 1000 mL via INTRAVENOUS

## 2018-11-17 MED ORDER — OXYCODONE-ACETAMINOPHEN 5-325 MG PO TABS: 2.0000 | ORAL_TABLET | ORAL | Status: DC | PRN

## 2018-11-17 MED ORDER — TERBUTALINE SULFATE 1 MG/ML IJ SOLN
0.2500 mg | Freq: Once | INTRAMUSCULAR | Status: DC | PRN
  Filled 2018-11-17: qty 1

## 2018-11-17 MED ORDER — LIDOCAINE HCL (PF) 1 % IJ SOLN: 30.0000 mL | INTRAMUSCULAR | Status: DC | PRN

## 2018-11-17 MED ORDER — ONDANSETRON HCL 4 MG/2ML IJ SOLN
4.0000 mg | Freq: Four times a day (QID) | INTRAMUSCULAR | Status: DC | PRN
  Administered 2018-11-18 (×2): 4 mg via INTRAVENOUS
  Filled 2018-11-17 (×2): qty 2

## 2018-11-17 MED ORDER — OXYTOCIN BOLUS FROM INFUSION: 500.0000 mL | Freq: Once | INTRAVENOUS | Status: DC

## 2018-11-17 MED ORDER — ZOLPIDEM TARTRATE 5 MG PO TABS
5.0000 mg | ORAL_TABLET | Freq: Every evening | ORAL | Status: DC | PRN
  Administered 2018-11-17: 5 mg via ORAL
  Filled 2018-11-17: qty 1

## 2018-11-17 MED ORDER — OXYTOCIN 40 UNITS IN NORMAL SALINE INFUSION - SIMPLE MED: 2.5000 [IU]/h | INTRAVENOUS | Status: DC

## 2018-11-17 MED ORDER — LACTATED RINGERS IV SOLN
INTRAVENOUS | Status: DC
  Administered 2018-11-18: 06:00:00 via INTRAVENOUS

## 2018-11-17 MED ORDER — MISOPROSTOL 25 MCG QUARTER TABLET
25.0000 ug | ORAL_TABLET | ORAL | Status: DC | PRN
  Administered 2018-11-17 (×3): 25 ug via VAGINAL
  Filled 2018-11-17 (×3): qty 1

## 2018-11-17 NOTE — Anesthesia Pain Management Evaluation Note (Signed)
  [facility] Pain Management Visit Note  Patient: Nichole Patterson, 28 y.o., female  "Hello I am a member of the anesthesia team at [facility]. We have an anesthesia team available at all times to provide care throughout the hospital, including epidural management and anesthesia for C-section. I don't know your plan for the delivery whether it a natural birth, water birth, IV sedation, nitrous supplementation, doula or epidural, but we want to meet your pain goals."   1.Was your pain managed to your expectations on prior hospitalizations?   Yes   2.What is your expectation for pain management during this hospitalization?     Epidural  3.How can we help you reach that goal? Epidural when desired  Record the patient's initial score and the patient's pain goal.   Pain: 0  Pain Goal: 5 The [facility] wants you to be able to say your pain was always managed very well.  Olanrewaju Osborn 11/17/2018

## 2018-11-17 NOTE — MAU Provider Note (Signed)
Pt informed that the ultrasound is considered a limited OB ultrasound and is not intended to be a complete ultrasound exam.  Patient also informed that the ultrasound is not being completed with the intent of assessing for fetal or placental anomalies or any pelvic abnormalities.  Explained that the purpose of today's ultrasound is to assess for  presentation.  Patient acknowledges the purpose of the exam and the limitations of the study.     Vertex position.   Nichole Patterson I, NP 11/17/2018 10:05 AM

## 2018-11-17 NOTE — Progress Notes (Signed)
LABOR PROGRESS NOTE  Nichole Patterson is a 28 y.o. G3P2002 at [redacted]w[redacted]d  admitted for IOL for decreased fetal movement. Noted to have one elevated BP at presentation.   Subjective: Strip note. Discussed plan of care with RN.   Objective: BP 120/69 (BP Location: Left Arm)   Pulse 92   Temp 98.8 F (37.1 C) (Oral)   Resp 16   Ht 5\' 3"  (1.6 m)   Wt 84.4 kg   LMP 02/07/2018 (Exact Date)   SpO2 100%   BMI 32.95 kg/m  or  Vitals:   11/17/18 1629 11/17/18 1742 11/17/18 1833 11/17/18 1926  BP: 115/64 123/72 120/73 120/69  Pulse: (!) 107 81 84 92  Resp: 18 18  16   Temp:   98.3 F (36.8 C) 98.8 F (37.1 C)  TempSrc:   Oral Oral  SpO2:    100%  Weight:      Height:       Dilation: 2 Effacement (%): 50 Cervical Position: Posterior Station: -2 Presentation: Vertex Exam by:: Henderson Newcomer, RN FHT: baseline rate 155, moderate varibility, +acel, no decel Toco: q3-7 min   Labs: Lab Results  Component Value Date   WBC 13.3 (H) 11/17/2018   HGB 9.2 (L) 11/17/2018   HCT 30.1 (L) 11/17/2018   MCV 81.8 11/17/2018   PLT 254 11/17/2018    Patient Active Problem List   Diagnosis Date Noted  . Decreased fetal movement 11/17/2018  . Late prenatal care 10/10/2018  . Supervision of other normal pregnancy, antepartum 07/10/2018    Assessment / Plan: 28 y.o. G3P2002 at [redacted]w[redacted]d here for IOL for decreased fetal movement. One elevated BP at admission, normal PIH labs.   Labor: Induction. Cervix unchanged. 3rd cytotec given.  Fetal Wellbeing:  Cat I  Pain Control:  Plans for epidural  Anticipated MOD:  NSVD  Marcy Siren, D.O. OB Fellow  11/17/2018, 9:20 PM

## 2018-11-17 NOTE — H&P (Signed)
Nichole Patterson is a 28 y.o. female presenting for induction of labor at [redacted]w[redacted]d with decreased fetal movement and one elevated BP in MAU today. [id number]. Late PNC at [facility] . OB History    Gravida  3   Para  2   Term  2   Preterm  0   AB  0   Living  2     SAB      TAB      Ectopic      Multiple      Live Births  2          Past Medical History:  Diagnosis Date  . Asthma   . Chlamydia   . Headache   . Heart murmur   . Trichomonas contact    Past Surgical History:  Procedure Laterality Date  . MOUTH SURGERY     Family History: family history includes Pneumonia in her father. Social History:  reports that she has never smoked. She has never used smokeless tobacco. She reports that she does not drink alcohol or use drugs.     Maternal Diabetes: No Genetic Screening: Normal low risk NIPS girl Maternal Ultrasounds/Referrals: Normal Fetal Ultrasounds or other Referrals:  None Maternal Substance Abuse:  No Significant Maternal Medications:  None Significant Maternal Lab Results:  None Other Comments:  None  Review of Systems  Constitutional: Negative.   HENT: Negative.   Respiratory: Negative.   Cardiovascular: Negative.   Neurological: Positive for headaches.  Psychiatric/Behavioral: Negative.    Maternal Medical History:  Reason for admission: Contractions.   Contractions: Onset was 3-5 hours ago.   Frequency: irregular.    Fetal activity: Perceived fetal activity is decreased.   Last perceived fetal movement was within the past hour.    Prenatal complications: no prenatal complications Prenatal Complications - Diabetes: none.    Dilation: Fingertip Effacement (%): Thick Station: -3 Exam by:: J Middleton RN Blood pressure 129/81, pulse 97, temperature 98.2 F (36.8 C), temperature source Oral, resp. rate 16, height 5\' 3"  (1.6 m), weight 84.4 kg, last menstrual period 02/07/2018, SpO2 100 %. Maternal Exam:  Uterine Assessment:  Contraction strength is mild.  Contraction frequency is irregular.   Abdomen: Patient reports no abdominal tenderness. Estimated fetal weight is 7 lb.   Fetal presentation: vertex  Introitus: not evaluated.     Physical Exam  Vitals reviewed. Constitutional: She is oriented to person, place, and time. She appears well-developed. No distress.  Cardiovascular: Normal rate.  Respiratory: Effort normal. No respiratory distress.  Musculoskeletal:        General: Edema (trace) present.  Neurological: She is alert and oriented to person, place, and time.  Skin: Skin is warm and dry.  Psychiatric: She has a normal mood and affect. Her behavior is normal.    Prenatal labs: ABO, Rh: --/--/A POS (01/19 4540) Antibody: NEG (01/19 9811) Rubella: 11.10 (11/12 1514) RPR: Non Reactive (11/12 1514)  HBsAg: Negative (11/12 1514)  HIV: Non Reactive (11/12 1514)  GBS: Negative (12/23 0000)   Assessment/Plan: Decreased fetal movement and elevated BP value at [redacted]w[redacted]d Admission for IOL with Cytotec for cervical ripening.   Scheryl Darter 11/17/2018, 11:45 AM

## 2018-11-17 NOTE — MAU Provider Note (Signed)
S: Ms. Nichole Patterson is a 28 y.o. G3P2002 at [redacted]w[redacted]d  who presents to MAU today complaining contractions irregular. . She denies vaginal bleeding. She denies LOF. She reports decreased fetal movement which is what brought her in. She feels pressure in her pelvis.   O: BP 127/70   Pulse 99   Temp (!) 97.3 F (36.3 C) (Oral)   Resp 17   Wt 84.4 kg   LMP 02/07/2018 (Exact Date)   SpO2 100% Comment: ra  BMI 32.95 kg/m  GENERAL: Well-developed, well-nourished female in no acute distress.  HEAD: Normocephalic, atraumatic.  CHEST: Normal effort of breathing, regular heart rate ABDOMEN: Soft, nontender, gravid  Cervical exam:  Dilation: Closed Effacement (%): Thick Cervical Position: Posterior Exam by:: Janeth Rase rN   Fetal Monitoring: Baseline: 140 Variability: moderate Accelerations: 10x10  Decelerations: none Contractions: Quiet    A: SIUP at [redacted]w[redacted]d  Decreased fetal movement @ [redacted]w[redacted]d One elevated BP with subsequent BP's WNL  P:  Post dates Admit to [facility] for cytotec induction.  GBS negative     Ajla Mcgeachy, Harolyn Rutherford, NP 11/17/2018 9:34 AM

## 2018-11-17 NOTE — MAU Note (Addendum)
Nichole Patterson is a 28 y.o. at [redacted]w[redacted]d here in MAU reporting:  +decreased fetal movement: reports some movement but "a lot less" than usual +vaginal pressure "like I want to push" +Abdominal cramping. Intermittent. Endorses being scheduled for induction on thursday Pain score: 9/10 Vitals:   11/17/18 0912  BP: (!) 141/85  Pulse: 98  Resp: 17  Temp: (!) 97.3 F (36.3 C)  SpO2: 100%     FHT: 161 via doppler Lab orders placed from triage: ua. Patient unable to void at this time. Reports she just went prior to triage.

## 2018-11-18 ENCOUNTER — Inpatient Hospital Stay (HOSPITAL_COMMUNITY): Payer: Medicaid Other | Admitting: Anesthesiology

## 2018-11-18 ENCOUNTER — Encounter (HOSPITAL_COMMUNITY): Payer: Self-pay | Admitting: Obstetrics and Gynecology

## 2018-11-18 ENCOUNTER — Encounter (HOSPITAL_COMMUNITY)
Admission: AD | Disposition: A | Discharge status: Home / Self Care | Payer: Self-pay | Source: Home / Self Care | Attending: Obstetrics and Gynecology

## 2018-11-18 LAB — RPR: RPR Ser Ql: NONREACTIVE

## 2018-11-18 SURGERY — Surgical Case
Anesthesia: Epidural | Wound class: Clean Contaminated

## 2018-11-18 MED ORDER — LIDOCAINE HCL (PF) 1 % IJ SOLN
INTRAMUSCULAR | Status: DC | PRN
  Administered 2018-11-18: 13 mL via EPIDURAL

## 2018-11-18 MED ORDER — MORPHINE SULFATE (PF) 0.5 MG/ML IJ SOLN
INTRAMUSCULAR | Status: AC
  Filled 2018-11-18: qty 10

## 2018-11-18 MED ORDER — EPHEDRINE 5 MG/ML INJ: 10.0000 mg | INTRAVENOUS | Status: DC | PRN

## 2018-11-18 MED ORDER — DIPHENHYDRAMINE HCL 50 MG/ML IJ SOLN: 12.5000 mg | INTRAMUSCULAR | Status: DC | PRN

## 2018-11-18 MED ORDER — OXYTOCIN 40 UNITS IN NORMAL SALINE INFUSION - SIMPLE MED
1.0000 m[IU]/min | INTRAVENOUS | Status: DC
  Administered 2018-11-18: 2 m[IU]/min via INTRAVENOUS
  Filled 2018-11-18: qty 1000

## 2018-11-18 MED ORDER — DEXAMETHASONE SODIUM PHOSPHATE 10 MG/ML IJ SOLN
INTRAMUSCULAR | Status: AC
  Filled 2018-11-18: qty 1

## 2018-11-18 MED ORDER — LACTATED RINGERS AMNIOINFUSION
INTRAVENOUS | Status: DC
  Administered 2018-11-18: 13:00:00 via INTRAUTERINE

## 2018-11-18 MED ORDER — LIDOCAINE-EPINEPHRINE (PF) 2 %-1:200000 IJ SOLN
INTRAMUSCULAR | Status: AC
  Filled 2018-11-18: qty 20

## 2018-11-18 MED ORDER — FENTANYL 2.5 MCG/ML BUPIVACAINE 1/10 % EPIDURAL INFUSION (WH - ANES)
INTRAMUSCULAR | Status: AC
  Filled 2018-11-18: qty 100

## 2018-11-18 MED ORDER — PHENYLEPHRINE 40 MCG/ML (10ML) SYRINGE FOR IV PUSH (FOR BLOOD PRESSURE SUPPORT)
PREFILLED_SYRINGE | INTRAVENOUS | Status: AC
  Filled 2018-11-18: qty 10

## 2018-11-18 MED ORDER — FENTANYL 2.5 MCG/ML BUPIVACAINE 1/10 % EPIDURAL INFUSION (WH - ANES)
14.0000 mL/h | INTRAMUSCULAR | Status: DC | PRN
  Administered 2018-11-18: 14 mL/h via EPIDURAL

## 2018-11-18 MED ORDER — ONDANSETRON HCL 4 MG/2ML IJ SOLN
INTRAMUSCULAR | Status: AC
  Filled 2018-11-18: qty 2

## 2018-11-18 MED ORDER — SOD CITRATE-CITRIC ACID 500-334 MG/5ML PO SOLN
30.0000 mL | ORAL | Status: AC
  Administered 2018-11-18: 30 mL via ORAL

## 2018-11-18 MED ORDER — EPHEDRINE 5 MG/ML INJ
INTRAVENOUS | Status: AC
  Filled 2018-11-18: qty 4

## 2018-11-18 MED ORDER — FENTANYL 2.5 MCG/ML BUPIVACAINE 1/10 % EPIDURAL INFUSION (WH - ANES)
INTRAMUSCULAR | Status: DC | PRN
  Administered 2018-11-18: 300 ug via EPIDURAL
  Administered 2018-11-18: 14 mL/h via EPIDURAL

## 2018-11-18 MED ORDER — DIPHENHYDRAMINE HCL 50 MG/ML IJ SOLN
12.5000 mg | Freq: Once | INTRAMUSCULAR | Status: AC
  Administered 2018-11-18: 12.5 mg via INTRAVENOUS
  Filled 2018-11-18: qty 1

## 2018-11-18 MED ORDER — PHENYLEPHRINE 40 MCG/ML (10ML) SYRINGE FOR IV PUSH (FOR BLOOD PRESSURE SUPPORT): 80.0000 ug | PREFILLED_SYRINGE | INTRAVENOUS | Status: DC | PRN

## 2018-11-18 MED ORDER — OXYTOCIN 10 UNIT/ML IJ SOLN
INTRAMUSCULAR | Status: AC
  Filled 2018-11-18: qty 4

## 2018-11-18 MED ORDER — SCOPOLAMINE 1 MG/3DAYS TD PT72
MEDICATED_PATCH | TRANSDERMAL | Status: AC
  Filled 2018-11-18: qty 1

## 2018-11-18 MED ORDER — MISOPROSTOL 50MCG HALF TABLET
50.0000 ug | ORAL_TABLET | ORAL | Status: DC | PRN
  Administered 2018-11-18: 50 ug via ORAL
  Filled 2018-11-18: qty 1

## 2018-11-18 MED ORDER — SODIUM CHLORIDE 0.9 % IV SOLN
500.0000 mg | Freq: Once | INTRAVENOUS | Status: AC
  Administered 2018-11-18: 500 mg via INTRAVENOUS
  Filled 2018-11-18: qty 500

## 2018-11-18 MED ORDER — CEFAZOLIN SODIUM-DEXTROSE 2-4 GM/100ML-% IV SOLN
2.0000 g | INTRAVENOUS | Status: AC
  Administered 2018-11-18: 2 g via INTRAVENOUS

## 2018-11-18 MED ORDER — SODIUM BICARBONATE 8.4 % IV SOLN
INTRAVENOUS | Status: AC
  Filled 2018-11-18: qty 50

## 2018-11-18 MED ORDER — TERBUTALINE SULFATE 1 MG/ML IJ SOLN
0.2500 mg | Freq: Once | INTRAMUSCULAR | Status: AC | PRN
  Administered 2018-11-18: 0.25 mg via SUBCUTANEOUS

## 2018-11-18 MED ORDER — MEPERIDINE HCL 25 MG/ML IJ SOLN
INTRAMUSCULAR | Status: AC
  Filled 2018-11-18: qty 1

## 2018-11-18 MED ORDER — LACTATED RINGERS IV SOLN: 500.0000 mL | Freq: Once | INTRAVENOUS | Status: DC

## 2018-11-18 SURGICAL SUPPLY — 36 items
BENZOIN TINCTURE PRP APPL 2/3 (GAUZE/BANDAGES/DRESSINGS) ×3 IMPLANT
CANISTER SUCT 3000ML PPV (MISCELLANEOUS) ×3 IMPLANT
CHLORAPREP W/TINT 26ML (MISCELLANEOUS) ×3 IMPLANT
CLOSURE WOUND 1/2 X4 (GAUZE/BANDAGES/DRESSINGS) ×1
DRSG OPSITE POSTOP 4X10 (GAUZE/BANDAGES/DRESSINGS) ×3 IMPLANT
ELECT REM PT RETURN 9FT ADLT (ELECTROSURGICAL) ×3
ELECTRODE REM PT RTRN 9FT ADLT (ELECTROSURGICAL) ×1 IMPLANT
EXTRACTOR VACUUM KIWI (MISCELLANEOUS) ×3 IMPLANT
GLOVE BIOGEL PI IND STRL 7.0 (GLOVE) ×2 IMPLANT
GLOVE BIOGEL PI IND STRL 7.5 (GLOVE) ×1 IMPLANT
GLOVE BIOGEL PI INDICATOR 7.0 (GLOVE) ×4
GLOVE BIOGEL PI INDICATOR 7.5 (GLOVE) ×2
GLOVE SKINSENSE NS SZ7.0 (GLOVE) ×2
GLOVE SKINSENSE STRL SZ7.0 (GLOVE) ×1 IMPLANT
GOWN STRL REUS W/ TWL LRG LVL3 (GOWN DISPOSABLE) ×2 IMPLANT
GOWN STRL REUS W/ TWL XL LVL3 (GOWN DISPOSABLE) ×1 IMPLANT
GOWN STRL REUS W/TWL LRG LVL3 (GOWN DISPOSABLE) ×4
GOWN STRL REUS W/TWL XL LVL3 (GOWN DISPOSABLE) ×2
NS IRRIG 1000ML POUR BTL (IV SOLUTION) ×3 IMPLANT
PACK C SECTION WH (CUSTOM PROCEDURE TRAY) ×3 IMPLANT
PAD ABD 7.5X8 STRL (GAUZE/BANDAGES/DRESSINGS) ×3 IMPLANT
PAD OB MATERNITY 4.3X12.25 (PERSONAL CARE ITEMS) ×3 IMPLANT
PAD PREP 24X48 CUFFED NSTRL (MISCELLANEOUS) ×3 IMPLANT
PENCIL SMOKE EVAC W/HOLSTER (ELECTROSURGICAL) ×3 IMPLANT
SPONGE LAP 18X18 RF (DISPOSABLE) ×9 IMPLANT
STRIP CLOSURE SKIN 1/2X4 (GAUZE/BANDAGES/DRESSINGS) ×2 IMPLANT
SUT CHROMIC 1 CTX 36 (SUTURE) ×2 IMPLANT
SUT MNCRL 0 VIOLET CTX 36 (SUTURE) ×2 IMPLANT
SUT MON AB 4-0 PS1 27 (SUTURE) ×3 IMPLANT
SUT MONOCRYL 0 CTX 36 (SUTURE) ×4
SUT PLAIN 2 0 XLH (SUTURE) ×3 IMPLANT
SUT VIC AB 0 CT1 36 (SUTURE) ×6 IMPLANT
SUT VIC AB 3-0 CT1 27 (SUTURE) ×2
SUT VIC AB 3-0 CT1 TAPERPNT 27 (SUTURE) ×1 IMPLANT
TAPE STRIPS DRAPE STRL (GAUZE/BANDAGES/DRESSINGS) ×2 IMPLANT
TOWEL OR 17X24 6PK STRL BLUE (TOWEL DISPOSABLE) ×6 IMPLANT

## 2018-11-18 NOTE — Progress Notes (Addendum)
L&D Note  Pt comfortable and epidural working. Terb given at 1701 and UCs q7460m prior. Epidural working well  150 baseline, no accels, +late decels for approx 90 sec, mod variability q2-2576m  UCs now q1344m after terb with improved decel  1145 5cm 1415 7/-2 1530 8/-2 SVE 8/70/presenting part -2  A/p: pt stable Category II tracing but with good recovery and moderate variablity. UCs improved with terb and pt is a multip but  is making slow cx change. Pelvis feels adequate and efw 3400gm with h/o approx 6lbs 10oz children in the past and states last one was OP (not breech), will change in the chart. Recheck in 80444m and prn. Continue to hold pitocin  Cornelia Copaharlie Deborah Lazcano, Jr MD Attending Center for [facility] (Faculty Practice) 11/18/2018 Time: [phone]

## 2018-11-18 NOTE — Progress Notes (Signed)
L&D Note Pitocin turned off due to recurrent late decels.  Category II with improvement in tracing and less apparent decels SVE unchanged  D/w pt recommendation for c-section given lack of cx change on pitocin. Pt amenable to c-section  Can proceed when OR is ready  Cornelia Copa MD Attending [facility] for [employer] (Faculty Practice) 11/18/2018 Time: 2300

## 2018-11-18 NOTE — Anesthesia Preprocedure Evaluation (Signed)
Anesthesia Evaluation  Patient identified by MRN, date of birth, ID band Patient awake    Reviewed: Allergy & Precautions, NPO status , Patient's Chart, lab work & pertinent test results  Airway Mallampati: II  TM Distance: >3 FB Neck ROM: Full    Dental no notable dental hx.    Pulmonary asthma ,    Pulmonary exam normal breath sounds clear to auscultation       Cardiovascular negative cardio ROS Normal cardiovascular exam Rhythm:Regular Rate:Normal     Neuro/Psych  Headaches, negative psych ROS   GI/Hepatic negative GI ROS, Neg liver ROS,   Endo/Other  negative endocrine ROS  Renal/GU negative Renal ROS  negative genitourinary   Musculoskeletal negative musculoskeletal ROS (+)   Abdominal   Peds negative pediatric ROS (+)  Hematology negative hematology ROS (+)   Anesthesia Other Findings   Reproductive/Obstetrics (+) Pregnancy                             Anesthesia Physical Anesthesia Plan  ASA: II  Anesthesia Plan: Epidural   Post-op Pain Management:    Induction:   PONV Risk Score and Plan:   Airway Management Planned:   Additional Equipment:   Intra-op Plan:   Post-operative Plan:   Informed Consent:   Plan Discussed with:   Anesthesia Plan Comments:         Anesthesia Quick Evaluation

## 2018-11-18 NOTE — Progress Notes (Signed)
LABOR PROGRESS NOTE  Nichole Patterson is a 28 y.o. G3P2002 at [redacted]w[redacted]d  admitted for [facility]  Subjective: Patient doing well, comfortable with epidural   Objective: BP (!) 95/57   Pulse 88   Temp 97.7 F (36.5 C) (Axillary)   Resp 18   Ht 5\' 3"  (1.6 m)   Wt 84.4 kg   LMP 02/07/2018 (Exact Date)   SpO2 100%   BMI 32.95 kg/m  or  Vitals:   11/18/18 1101 11/18/18 1131 11/18/18 1201 11/18/18 1231  BP: 125/80 119/78 111/67 (!) 95/57  Pulse: (!) 113 (!) 101 90 88  Resp: 18 18 18    Temp: 97.7 F (36.5 C)     TempSrc: Axillary     SpO2:      Weight:      Height:        Currently on 72milli-unit/min, IUPC placed  Dilation: 5 Effacement (%): 80 Cervical Position: Posterior Station: -2 Presentation: Vertex Exam by:: [place] CNM FHT: baseline rate 150, moderate varibility, +accel, variable decel Toco: 1-5  Labs: Lab Results  Component Value Date   WBC 13.3 (H) 11/17/2018   HGB 9.2 (L) 11/17/2018   HCT 30.1 (L) 11/17/2018   MCV 81.8 11/17/2018   PLT 254 11/17/2018    Patient Active Problem List   Diagnosis Date Noted  . Decreased fetal movement 11/17/2018  . Late prenatal care 10/10/2018  . Supervision of other normal pregnancy, antepartum 07/10/2018    Assessment / Plan: 29 y.o. G3P2002 at [redacted]w[redacted]d here for IOL for DFM   Labor: IUPC placed due to no cervical change in 4 hours, patient repositioned to right side with peanut Fetal Wellbeing:  Cat II Pain Control:  Epidural  Anticipated MOD:  SVD  Sharyon Cable, CNM 11/18/2018, 12:47 PM

## 2018-11-18 NOTE — Progress Notes (Signed)
L&D Note  Category II due to occasional subtle lates, toco q38m SVE: unchanged at 8/70/high  Will switch to semi fowlers and if fetus continues to look fine then d/w pt to restart pitocin in . Pt was at 6 and will start at 3. Pt amenable to plan  Cornelia Copa MD Attending Center for [facility] (Faculty Practice) 11/18/2018 Time: [phone]

## 2018-11-18 NOTE — Progress Notes (Signed)
L&D Note Fetus looking much better and category I with accels and MVUs now in the mid 100s. D/w pt will defer check for now and rpt cx check at 2230 since fetus looks so much better; will continue to up titrate the pitocin. Pt amenable to plan  Nichole Patterson, Jr MD Attending Center [facility]) 11/18/2018 Time: 2115

## 2018-11-18 NOTE — Anesthesia Procedure Notes (Signed)
Epidural Patient location during procedure: OB Start time: 11/18/2018 5:33 AM End time: 11/18/2018 5:48 AM  Staffing Anesthesiologist: Lowella Curb, MD Performed: anesthesiologist   Preanesthetic Checklist Completed: patient identified, site marked, surgical consent, pre-op evaluation, timeout performed, IV checked, risks and benefits discussed and monitors and equipment checked  Epidural Patient position: sitting Prep: ChloraPrep Patient monitoring: heart rate, cardiac monitor, continuous pulse ox and blood pressure Approach: midline Location: L2-L3 Injection technique: LOR saline  Needle:  Needle type: Tuohy  Needle gauge: 17 G Needle length: 9 cm Needle insertion depth: 5 cm Catheter type: closed end flexible Catheter size: 20 Guage Catheter at skin depth: 9 cm Test dose: negative  Assessment Events: blood not aspirated, injection not painful, no injection resistance, negative IV test and no paresthesia  Additional Notes Reason for block:procedure for pain

## 2018-11-18 NOTE — Progress Notes (Signed)
L&D Note Still category II due to occasional late decels with q4-31m UCs, pitocin at 7 now with MVUs a little over 100. SVE unchanged and fetus still out of pelvis. D/w pt that will recheck around 9pm and if unchanged I would favor c-section due to persistent category II tracing and lack of cervical change since 1530 today with her being on pitocin all day except from 1700-1830.  Cornelia Copa MD Attending Center for [employer] (Faculty Practice) 11/18/2018 Time: [phone]

## 2018-11-18 NOTE — Progress Notes (Signed)
LABOR PROGRESS NOTE  Katilin Im is a 28 y.o. G3P2002 at [redacted]w[redacted]d  admitted for IOL for decreased fetal movement. Noted to have one elevated BP at presentation.   Subjective: Strip note. Discussed plan of care with RN.   Objective: BP 134/78   Pulse 85   Temp 97.9 F (36.6 C) (Temporal)   Resp 16   Ht 5\' 3"  (1.6 m)   Wt 84.4 kg   LMP 02/07/2018 (Exact Date)   SpO2 100%   BMI 32.95 kg/m  or  Vitals:   11/17/18 1926 11/17/18 1930 11/17/18 2108 11/17/18 2218  BP: 120/69 120/69 121/80 134/78  Pulse: 92 90 89 85  Resp: 16   16  Temp: 98.8 F (37.1 C)   97.9 F (36.6 C)  TempSrc: Oral   Temporal  SpO2: 100%     Weight:      Height:       Dilation: 2 Effacement (%): 50 Cervical Position: Posterior Station: -3 Presentation: Vertex Exam by:: R. Craft, RN FHT: baseline rate 145, moderate varibility, +acel, no decel Toco: Irregular   Labs: Lab Results  Component Value Date   WBC 13.3 (H) 11/17/2018   HGB 9.2 (L) 11/17/2018   HCT 30.1 (L) 11/17/2018   MCV 81.8 11/17/2018   PLT 254 11/17/2018    Patient Active Problem List   Diagnosis Date Noted  . Decreased fetal movement 11/17/2018  . Late prenatal care 10/10/2018  . Supervision of other normal pregnancy, antepartum 07/10/2018    Assessment / Plan: 28 y.o. G3P2002 at [redacted]w[redacted]d here for IOL for decreased fetal movement. One elevated BP at admission, normal PIH labs.   Labor: Induction. Cervix unchanged. Will give 4th cytotec, change route to PO.  Fetal Wellbeing:  Cat I  Pain Control:  Plans for epidural  Anticipated MOD:  NSVD  Marcy Siren, D.O. OB Fellow  11/18/2018, 1:25 AM

## 2018-11-18 NOTE — Progress Notes (Signed)
LABOR PROGRESS NOTE  Nichole Patterson is a 28 y.o. G3P2002 at [redacted]w[redacted]d  admitted for DFM and postdates   Subjective: Patient doing well, in and out of sleep in room- patient currently positioned on left side with peanut   Objective: BP 94/70   Pulse 86   Temp (!) 97.3 F (36.3 C) (Axillary)   Resp 18   Ht 5\' 3"  (1.6 m)   Wt 84.4 kg   LMP 02/07/2018 (Exact Date)   SpO2 100%   BMI 32.95 kg/m  or  Vitals:   11/18/18 0901 11/18/18 0918 11/18/18 0931 11/18/18 1001  BP: (!) 100/54 (!) 104/55 (!) 112/51 94/70  Pulse: 81 74 75 86  Resp: 18 18  18   Temp:      TempSrc:      SpO2:      Weight:      Height:        Currently on 4 milli unit of pitocin  Dilation: 5 Effacement (%): 80 Cervical Position: Posterior Station: -2 Presentation: Vertex Exam by:: Foye Clock RN FHT: baseline rate 140, moderate varibility, +accel, occasional variable decel Toco: 2-5  Labs: Lab Results  Component Value Date   WBC 13.3 (H) 11/17/2018   HGB 9.2 (L) 11/17/2018   HCT 30.1 (L) 11/17/2018   MCV 81.8 11/17/2018   PLT 254 11/17/2018    Patient Active Problem List   Diagnosis Date Noted  . Decreased fetal movement 11/17/2018  . Late prenatal care 10/10/2018  . Supervision of other normal pregnancy, antepartum 07/10/2018    Assessment / Plan: 28 y.o. G3P2002 at [redacted]w[redacted]d here for IOL for DFM  Labor: Continue titration of pitocin until active labor, possible IUPC at next cervical examination  Fetal Wellbeing:  Cat II  Pain Control:  Epidural  Anticipated MOD:  SVD   Sharyon Cable, CNM 11/18/2018, 10:17 AM

## 2018-11-19 DIAGNOSIS — Z3A40 40 weeks gestation of pregnancy: Secondary | ICD-10-CM

## 2018-11-19 DIAGNOSIS — O368130 Decreased fetal movements, third trimester, not applicable or unspecified: Secondary | ICD-10-CM

## 2018-11-19 LAB — COMPREHENSIVE METABOLIC PANEL
ALT: 22 U/L (ref 0–44)
ALT: 22 U/L (ref 0–44)
AST: 35 U/L (ref 15–41)
AST: 32 U/L (ref 15–41)
Albumin: 2.6 g/dL — ABNORMAL LOW (ref 3.5–5.0)
Albumin: 2.2 g/dL — ABNORMAL LOW (ref 3.5–5.0)
Alkaline Phosphatase: 132 U/L — ABNORMAL HIGH (ref 38–126)
Alkaline Phosphatase: 110 U/L (ref 38–126)
Anion gap: 10 (ref 5–15)
Anion gap: 9 (ref 5–15)
BUN: 16 mg/dL (ref 6–20)
BUN: 16 mg/dL (ref 6–20)
CO2: 21 mmol/L — ABNORMAL LOW (ref 22–32)
CO2: 22 mmol/L (ref 22–32)
Calcium: 8.7 mg/dL — ABNORMAL LOW (ref 8.9–10.3)
Calcium: 8.1 mg/dL — ABNORMAL LOW (ref 8.9–10.3)
Chloride: 102 mmol/L (ref 98–111)
Chloride: 102 mmol/L (ref 98–111)
Creatinine, Ser: 1.25 mg/dL — ABNORMAL HIGH (ref 0.44–1.00)
Creatinine, Ser: 1.13 mg/dL — ABNORMAL HIGH (ref 0.44–1.00)
GFR calc Af Amer: >60 mL/min (ref >60)
GFR calc non Af Amer: 59 mL/min — ABNORMAL LOW (ref >60)
GFR calc non Af Amer: >60 mL/min (ref >60)
GLUCOSE: 122 mg/dL — AB (ref 70–99)
Glucose, Bld: 128 mg/dL — ABNORMAL HIGH (ref 70–99)
Potassium: 5.2 mmol/L — ABNORMAL HIGH (ref 3.5–5.1)
Potassium: 4.7 mmol/L (ref 3.5–5.1)
SODIUM: 133 mmol/L — AB (ref 135–145)
Sodium: 133 mmol/L — ABNORMAL LOW (ref 135–145)
TOTAL PROTEIN: 5.8 g/dL — AB (ref 6.5–8.1)
Total Bilirubin: 0.6 mg/dL (ref 0.3–1.2)
Total Bilirubin: 0.4 mg/dL (ref 0.3–1.2)
Total Protein: 4.7 g/dL — ABNORMAL LOW (ref 6.5–8.1)

## 2018-11-19 LAB — CBC
HCT: 28.8 % — ABNORMAL LOW (ref 36.0–46.0)
Hemoglobin: 9 g/dL — ABNORMAL LOW (ref 12.0–15.0)
MCH: 25.1 pg — ABNORMAL LOW (ref 26.0–34.0)
MCHC: 31.3 g/dL (ref 30.0–36.0)
MCV: 80.2 fL (ref 80.0–100.0)
Platelets: 263 10*3/uL (ref 150–400)
RBC: 3.59 MIL/uL — ABNORMAL LOW (ref 3.87–5.11)
RDW: 14.4 % (ref 11.5–15.5)
WBC: 32.5 10*3/uL — ABNORMAL HIGH (ref 4.0–10.5)
nRBC: 0 % (ref 0.0–0.2)

## 2018-11-19 LAB — CREATININE, SERUM
Creatinine, Ser: 1.32 mg/dL — ABNORMAL HIGH (ref 0.44–1.00)
GFR calc Af Amer: >60 mL/min (ref >60)
GFR calc non Af Amer: 56 mL/min — ABNORMAL LOW (ref >60)

## 2018-11-19 MED ORDER — LIDOCAINE-EPINEPHRINE (PF) 2 %-1:200000 IJ SOLN
INTRAMUSCULAR | Status: DC | PRN
  Administered 2018-11-18 (×4): 5 mL via EPIDURAL

## 2018-11-19 MED ORDER — DIPHENHYDRAMINE HCL 50 MG/ML IJ SOLN: 12.5000 mg | INTRAMUSCULAR | Status: DC | PRN

## 2018-11-19 MED ORDER — DEXAMETHASONE SODIUM PHOSPHATE 10 MG/ML IJ SOLN
INTRAMUSCULAR | Status: DC | PRN
  Administered 2018-11-18: 10 mg via INTRAVENOUS

## 2018-11-19 MED ORDER — CYCLOBENZAPRINE HCL 10 MG PO TABS
10.0000 mg | ORAL_TABLET | Freq: Three times a day (TID) | ORAL | Status: DC | PRN
  Filled 2018-11-19: qty 1

## 2018-11-19 MED ORDER — SODIUM CHLORIDE 0.9 % IR SOLN
Status: DC | PRN
  Administered 2018-11-19 (×2): 1

## 2018-11-19 MED ORDER — PRENATAL MULTIVITAMIN CH
1.0000 | ORAL_TABLET | Freq: Every day | ORAL | Status: DC
  Administered 2018-11-19 – 2018-11-20 (×2): 1 via ORAL
  Filled 2018-11-19 (×2): qty 1

## 2018-11-19 MED ORDER — NALBUPHINE HCL 10 MG/ML IJ SOLN: 5.0000 mg | Freq: Once | INTRAMUSCULAR | Status: DC | PRN

## 2018-11-19 MED ORDER — ACETAMINOPHEN 500 MG PO TABS
1000.0000 mg | ORAL_TABLET | Freq: Four times a day (QID) | ORAL | Status: DC
  Administered 2018-11-19 – 2018-11-21 (×8): 1000 mg via ORAL
  Filled 2018-11-19 (×9): qty 2

## 2018-11-19 MED ORDER — LACTATED RINGERS IV SOLN
INTRAVENOUS | Status: DC
  Administered 2018-11-19 (×2): via INTRAVENOUS

## 2018-11-19 MED ORDER — FERROUS SULFATE 325 (65 FE) MG PO TABS
325.0000 mg | ORAL_TABLET | Freq: Two times a day (BID) | ORAL | Status: DC
  Administered 2018-11-19 – 2018-11-21 (×5): 325 mg via ORAL
  Filled 2018-11-19 (×5): qty 1

## 2018-11-19 MED ORDER — PROMETHAZINE HCL 25 MG/ML IJ SOLN: 6.2500 mg | INTRAMUSCULAR | Status: DC | PRN

## 2018-11-19 MED ORDER — SODIUM CHLORIDE 0.9% FLUSH: 3.0000 mL | INTRAVENOUS | Status: DC | PRN

## 2018-11-19 MED ORDER — SCOPOLAMINE 1 MG/3DAYS TD PT72
MEDICATED_PATCH | TRANSDERMAL | Status: DC | PRN
  Administered 2018-11-19: 1 via TRANSDERMAL

## 2018-11-19 MED ORDER — LACTATED RINGERS IV SOLN
INTRAVENOUS | Status: DC | PRN
  Administered 2018-11-18 (×2): via INTRAVENOUS

## 2018-11-19 MED ORDER — OXYTOCIN 40 UNITS IN NORMAL SALINE INFUSION - SIMPLE MED: 2.5000 [IU]/h | INTRAVENOUS | Status: AC

## 2018-11-19 MED ORDER — DIPHENHYDRAMINE HCL 25 MG PO CAPS
25.0000 mg | ORAL_CAPSULE | ORAL | Status: DC | PRN
  Filled 2018-11-19: qty 1

## 2018-11-19 MED ORDER — NALOXONE HCL 4 MG/10ML IJ SOLN
1.0000 ug/kg/h | INTRAVENOUS | Status: DC | PRN
  Filled 2018-11-19: qty 5

## 2018-11-19 MED ORDER — MEPERIDINE HCL 25 MG/ML IJ SOLN
6.2500 mg | INTRAMUSCULAR | Status: DC | PRN
  Administered 2018-11-19: 6.25 mg via INTRAVENOUS

## 2018-11-19 MED ORDER — SIMETHICONE 80 MG PO CHEW
80.0000 mg | CHEWABLE_TABLET | ORAL | Status: DC
  Administered 2018-11-19 – 2018-11-20 (×2): 80 mg via ORAL
  Filled 2018-11-19 (×2): qty 1

## 2018-11-19 MED ORDER — NALOXONE HCL 0.4 MG/ML IJ SOLN: 0.4000 mg | INTRAMUSCULAR | Status: DC | PRN

## 2018-11-19 MED ORDER — MEPERIDINE HCL 25 MG/ML IJ SOLN
INTRAMUSCULAR | Status: AC
  Filled 2018-11-19: qty 1

## 2018-11-19 MED ORDER — MENTHOL 3 MG MT LOZG: 1.0000 | LOZENGE | OROMUCOSAL | Status: DC | PRN

## 2018-11-19 MED ORDER — SIMETHICONE 80 MG PO CHEW: 80.0000 mg | CHEWABLE_TABLET | ORAL | Status: DC | PRN

## 2018-11-19 MED ORDER — SCOPOLAMINE 1 MG/3DAYS TD PT72: 1.0000 | MEDICATED_PATCH | Freq: Once | TRANSDERMAL | Status: DC

## 2018-11-19 MED ORDER — MEPERIDINE HCL 25 MG/ML IJ SOLN
INTRAMUSCULAR | Status: DC | PRN
  Administered 2018-11-18 – 2018-11-19 (×2): 12.5 mg via INTRAVENOUS

## 2018-11-19 MED ORDER — OXYTOCIN 10 UNIT/ML IJ SOLN
INTRAVENOUS | Status: DC | PRN
  Administered 2018-11-18: 40 [IU] via INTRAVENOUS

## 2018-11-19 MED ORDER — SENNOSIDES-DOCUSATE SODIUM 8.6-50 MG PO TABS
2.0000 | ORAL_TABLET | ORAL | Status: DC
  Administered 2018-11-19 – 2018-11-20 (×2): 2 via ORAL
  Filled 2018-11-19 (×2): qty 2

## 2018-11-19 MED ORDER — OXYCODONE HCL 5 MG PO TABS
5.0000 mg | ORAL_TABLET | ORAL | Status: DC | PRN
  Administered 2018-11-19: 5 mg via ORAL
  Administered 2018-11-19: 10 mg via ORAL
  Administered 2018-11-19: 5 mg via ORAL
  Administered 2018-11-20 (×2): 10 mg via ORAL
  Administered 2018-11-21: 5 mg via ORAL
  Filled 2018-11-19: qty 1
  Filled 2018-11-19 (×2): qty 2
  Filled 2018-11-19 (×3): qty 1
  Filled 2018-11-19: qty 2
  Filled 2018-11-19 (×2): qty 1

## 2018-11-19 MED ORDER — NALBUPHINE HCL 10 MG/ML IJ SOLN: 5.0000 mg | INTRAMUSCULAR | Status: DC | PRN

## 2018-11-19 MED ORDER — COCONUT OIL OIL: 1.0000 "application " | TOPICAL_OIL | Status: DC | PRN

## 2018-11-19 MED ORDER — DIPHENHYDRAMINE HCL 25 MG PO CAPS
25.0000 mg | ORAL_CAPSULE | Freq: Four times a day (QID) | ORAL | Status: DC | PRN
  Administered 2018-11-19 (×2): 25 mg via ORAL
  Filled 2018-11-19 (×2): qty 1

## 2018-11-19 MED ORDER — ONDANSETRON HCL 4 MG/2ML IJ SOLN
INTRAMUSCULAR | Status: DC | PRN
  Administered 2018-11-18: 4 mg via INTRAVENOUS

## 2018-11-19 MED ORDER — TETANUS-DIPHTH-ACELL PERTUSSIS 5-2.5-18.5 LF-MCG/0.5 IM SUSP: 0.5000 mL | Freq: Once | INTRAMUSCULAR | Status: DC

## 2018-11-19 MED ORDER — KETOROLAC TROMETHAMINE 30 MG/ML IJ SOLN
30.0000 mg | Freq: Once | INTRAMUSCULAR | Status: AC
  Administered 2018-11-19: 30 mg via INTRAVENOUS

## 2018-11-19 MED ORDER — LACTATED RINGERS IV SOLN
INTRAVENOUS | Status: DC | PRN
  Administered 2018-11-19: via INTRAVENOUS

## 2018-11-19 MED ORDER — ENOXAPARIN SODIUM 40 MG/0.4ML ~~LOC~~ SOLN
40.0000 mg | SUBCUTANEOUS | Status: DC
  Filled 2018-11-19 (×2): qty 0.4

## 2018-11-19 MED ORDER — ONDANSETRON HCL 4 MG/2ML IJ SOLN: 4.0000 mg | Freq: Three times a day (TID) | INTRAMUSCULAR | Status: DC | PRN

## 2018-11-19 MED ORDER — KETOROLAC TROMETHAMINE 30 MG/ML IJ SOLN
INTRAMUSCULAR | Status: AC
  Filled 2018-11-19: qty 1

## 2018-11-19 MED ORDER — WITCH HAZEL-GLYCERIN EX PADS: 1.0000 "application " | MEDICATED_PAD | CUTANEOUS | Status: DC | PRN

## 2018-11-19 MED ORDER — ZOLPIDEM TARTRATE 5 MG PO TABS: 5.0000 mg | ORAL_TABLET | Freq: Every evening | ORAL | Status: DC | PRN

## 2018-11-19 MED ORDER — DIBUCAINE 1 % RE OINT: 1.0000 "application " | TOPICAL_OINTMENT | RECTAL | Status: DC | PRN

## 2018-11-19 MED ORDER — FENTANYL CITRATE (PF) 100 MCG/2ML IJ SOLN: 25.0000 ug | INTRAMUSCULAR | Status: DC | PRN

## 2018-11-19 MED ORDER — MEASLES, MUMPS & RUBELLA VAC IJ SOLR
0.5000 mL | Freq: Once | INTRAMUSCULAR | Status: DC
  Filled 2018-11-19: qty 0.5

## 2018-11-19 MED ORDER — LACTATED RINGERS IV BOLUS
1000.0000 mL | Freq: Once | INTRAVENOUS | Status: AC
  Administered 2018-11-19: 1000 mL via INTRAVENOUS

## 2018-11-19 MED ORDER — SIMETHICONE 80 MG PO CHEW
80.0000 mg | CHEWABLE_TABLET | Freq: Three times a day (TID) | ORAL | Status: DC
  Administered 2018-11-19 – 2018-11-20 (×5): 80 mg via ORAL
  Filled 2018-11-19 (×6): qty 1

## 2018-11-19 MED ORDER — LACTATED RINGERS IV BOLUS
2000.0000 mL | Freq: Once | INTRAVENOUS | Status: AC
  Administered 2018-11-19: 2000 mL via INTRAVENOUS

## 2018-11-19 MED ORDER — IBUPROFEN 800 MG PO TABS
800.0000 mg | ORAL_TABLET | Freq: Three times a day (TID) | ORAL | Status: DC
  Administered 2018-11-19: 800 mg via ORAL
  Filled 2018-11-19: qty 1

## 2018-11-19 MED ORDER — MORPHINE SULFATE (PF) 0.5 MG/ML IJ SOLN
INTRAMUSCULAR | Status: DC | PRN
  Administered 2018-11-19: 3 mg via EPIDURAL

## 2018-11-19 NOTE — Anesthesia Postprocedure Evaluation (Signed)
Anesthesia Post Note  Patient: Nichole Patterson  Procedure(s) Performed: CESAREAN SECTION (N/A )     Patient location during evaluation: PACU Anesthesia Type: Epidural Level of consciousness: awake and alert Pain management: pain level controlled Vital Signs Assessment: post-procedure vital signs reviewed and stable Respiratory status: spontaneous breathing and respiratory function stable Cardiovascular status: blood pressure returned to baseline and stable Postop Assessment: spinal receding Anesthetic complications: no    Last Vitals:  Vitals:   11/19/18 0145 11/19/18 0200  BP:    Pulse:    Resp:    Temp: 36.8 C (!) 36.3 C  SpO2:      Last Pain:  Vitals:   11/19/18 0145  TempSrc: Oral  PainSc: 0-No pain   Pain Goal: Patients Stated Pain Goal: 0 (11/17/18 0912)                 Heather Roberts DANIEL

## 2018-11-19 NOTE — Progress Notes (Signed)
S: Called this morning due to elevated Creatinine at 1.32. On chart review, noted previously 0.75 on 11/17/18. Went to see patient who reports that she is not having any symptoms of PreE such as headache, blurry vision, nausea, vomiting, RUQ or epigastric pain.   O: BP 117/73 (BP Location: Right Arm)   Pulse 70   Temp 97.7 F (36.5 C) (Oral)   Resp 18   Ht 5\' 3"  (1.6 m)   Wt 84.4 kg   LMP 02/07/2018 (Exact Date)   SpO2 98%   BMI 32.95 kg/m   A:  # AKI, likely prerenal 2/2 low volume # hyperkalemia - K 5.2  P: - Ordered a 2L LR bolus and continue maintenance following bolus at 12425ml/hr for both treatment of elevated K and creatinine - Encourage oral hydration as well - Full CMP in addition to CBC;  Plts, AST and ALT normal  - DC ibuprofen and avoid any other nephrotoxic agents - Restart patient's home flexeril in addition to standard postop meds - Repeat CMP ordered for this afternoon - Will leave foley in place until after repeat labs - Discuss A/P with patient, who shows limited understanding, but is in agreement

## 2018-11-19 NOTE — Anesthesia Postprocedure Evaluation (Signed)
Anesthesia Post Note  Patient: Nichole Patterson  Procedure(s) Performed: CESAREAN SECTION (N/A )     Patient location during evaluation: Mother Baby Anesthesia Type: Epidural Level of consciousness: awake and alert and oriented Pain management: satisfactory to patient Vital Signs Assessment: post-procedure vital signs reviewed and stable Respiratory status: respiratory function stable and spontaneous breathing Cardiovascular status: blood pressure returned to baseline Postop Assessment: no headache, no backache, spinal receding, patient able to bend at knees and adequate PO intake Anesthetic complications: no    Last Vitals:  Vitals:   11/19/18 1000 11/19/18 1430  BP: 117/73 101/64  Pulse: 70 85  Resp: 18 18  Temp: 36.5 C 36.6 C  SpO2: 98% 100%    Last Pain:  Vitals:   11/19/18 1430  TempSrc: Oral  PainSc: 8    Pain Goal: Patients Stated Pain Goal: 0 (11/17/18 0912)                 Karleen Dolphin

## 2018-11-19 NOTE — Lactation Note (Signed)
This note was copied from a baby's chart. Lactation Consultation Note  Patient Name: Nichole Patterson [id number] Date: 11/19/2018 Reason for consult: Initial assessment;NICU baby  Baby is 77 hours old and in the NICU.  Mom states she breastfed her previous babies. She was unsure she wanted to breastfeed newborn but has decided to initiate pumping.  Symphony pump set up.  Mom does not want to begin pumping yet.  Instructed to pump and hand express every 3 hours.  She will let her nurse know when she is ready to pump.  Breastfeeding consultation services and support information given.  Providing Breastmilk For Your Baby In NICU book given.  Mom states she plans on purchasing a breast pump.  Encouraged to call for concerns/assist prn.  Maternal Data Has patient been taught Hand Expression?: Yes Does the patient have breastfeeding experience prior to this delivery?: Yes  Feeding Feeding Type: Formula Nipple Type: Slow - flow  LATCH Score                   Interventions    Lactation Tools Discussed/Used     Consult Status Consult Status: Follow-up Date: 11/20/18 Follow-up type: In-patient    Huston Foley 11/19/2018, 11:17 AM

## 2018-11-19 NOTE — Op Note (Addendum)
Cesarean Section Operative Report  Nichole Patterson  PROCEDURE DATE: 11/19/2018  PREOPERATIVE DIAGNOSES: Pregnancy at 40/5. Arrest of dilation at 8cm.   POSTOPERATIVE DIAGNOSES: The same  PROCEDURE: Primary Low Transverse Cesarean Section  SURGEON:   Surgeon(s) and Role:    * [place] BingPickens, Nyomie Ehrlich, MD - Primary    * Arvilla MarketWallace, Catherine Lauren, DO - Assisting -  OB Fellow  ASSISTANT:  Marcy Sirenatherine Wallace, DO - OB Fellow   INDICATIONS: Nichole Patterson is a 28 y.o. G3P2002 at 570w5d admitted for decreased fetal movement and ?gestational HTN. She had IOL with cytotec and then pitocin with slow cervical change and progress. On HD#2, the patient progressed to 8cm/70/and very high but did not change over 8 hours while on pitocin. She also had recurrent late decelerations which limited the ability of going up on pitocin and continued used.   FINDINGS:  Viable female infant in cephalic presentation, direct OP and fetus out of the pelvis.   Apgars 2 and 8.  Weight: 3650 g. Clear amniotic fluid.  Intact placenta, three vessel cord.  Normal uterus, fallopian tubes and ovaries bilaterally.  ANESTHESIA: Epidural INTRAVENOUS FLUIDS: 2000 mL  ESTIMATED BLOOD LOSS: 393 mL URINE OUTPUT:  100 ml SPECIMENS: Placenta sent to L&D COMPLICATIONS: None immediate  PROCEDURE IN DETAIL:  The patient was taken to the operating room where anesthesia was administered and normal fetal heart tones were confirmed. She was then prepped and draped in the normal fashion in the dorsal supine position with a leftward tilt.  After a time out was performed, a pfannensteil skin incision was made with the scalpel and carried through to the underlying layer of fascia. The fascia was then incised at the midline and this incision was extended laterally with the mayo scissors. Attention was turned to the superior aspect of the fascial incision which was grasped with the kocher clamps x 2, tented up and the rectus muscles were dissected off with the  bovie. In a similar fashion the inferior aspect of the fascial incision was grasped with the kocher clamps, tented up and the rectus muscles dissected off with the mayo scissors. The rectus muscles were then separated in the midline and the peritoneum was entered bluntly. The bladder blade was inserted and the vesicouterine peritoneum was identified, tented up and entered with the metzenbaum scissors. This incision was extended laterally and the bladder flap was created digitally. The bladder blade was reinserted.  A low transverse hysterotomy was made with the scalpel until the endometrial cavity was breached and the amniotic sac ruptured bluntly, yielding clear amniotic fluid. This incision was extended bluntly and the infant's head, shoulders and body were delivered atraumatically.The cord was clamped x 2 and cut, and the infant was handed to the awaiting pediatricians.  The placenta was then gradually expressed from the uterus and then the uterus was exteriorized and cleared of all clots and debris. The hysterotomy was repaired with a running suture of 1-0 monocryl. A second imbricating layer of 1-0 monocryl suture was then placed. A figure of eight #1 chromic was placed at the right lateral edge for additional hemostasis  The uterus and adnexa were then returned to the abdomen, and the hysterotomy and all operative sites were reinspected and excellent hemostasis was noted after irrigation and suction of the abdomen with warm saline. The fascia was reapproximated with 0 Vicryl in a simple running fashion bilaterally. The subcutaneous layer was then reapproximated with interrupted sutures of 2-0 plain gut, and the skin was then closed  with 4-0 monocryl, in a subcuticular fashion.  The patient tolerated the procedure well. Sponge, lap, instrument and needle counts were correct x 3.  She was taken to the recovery room in stable condition.     Disposition: PACU - hemodynamically stable.   Maternal  Condition: stable   Marcy Sirenatherine Wallace, D.O. OB Fellow  11/19/2018, 12:49 AM     Agree with above. I was present and scrubbed for the entire procedure.   Cornelia Copaharlie Khamryn Calderone, Jr MD Attending Center for [facility] Midwife(Faculty Practice)

## 2018-11-19 NOTE — Transfer of Care (Signed)
Immediate Anesthesia Transfer of Care Note  Patient: Nichole Patterson  Procedure(s) Performed: CESAREAN SECTION (N/A )  Patient Location: PACU  Anesthesia Type:Epidural  Level of Consciousness: awake, alert , oriented and patient cooperative  Airway & Oxygen Therapy: Patient Spontanous Breathing  Post-op Assessment: Report given to RN and Post -op Vital signs reviewed and stable  Post vital signs: Reviewed and stable  Last Vitals:  Vitals Value Taken Time  BP 121/69 11/19/2018 12:49 AM  Temp    Pulse 103 11/19/2018 12:51 AM  Resp 17 11/19/2018 12:51 AM  SpO2 100 % 11/19/2018 12:51 AM  Vitals shown include unvalidated device data.  Last Pain:  Vitals:   11/18/18 2205  TempSrc: Oral  PainSc:       Patients Stated Pain Goal: 0 (11/17/18 0912)  Complications: No apparent anesthesia complications

## 2018-11-19 NOTE — Progress Notes (Signed)
Dr. Vergie Living at bedside to re-evaluate FHR and cervical exam. Cervical exam unchanged per Dr. Vergie Living. Pt counseled and risk and benefits reviewed for primary c-section for arrest of dilation. Consent reviewed and signed. OR notified.   Rosine Door

## 2018-11-19 NOTE — Consult Note (Addendum)
Neonatology Note:   Attendance at C-section:    I was asked by Dr. Earlene Plater to attend this C/S at term due to [facility]. The mother is a G3P2002, GBS neg with good prenatal care (though late to establish care). SHe was admitted for decreased fetal movement and IOL.  Occasional decels through day.  ROM 21 hours before delivery, fluid clear.  Nuchal, reduced.  Infant not vigorous nor with great spontaneous cry and tone despite stimulation. Cord clamped and infant brought to warmer.   No air movement and HR ~80.  Dried and stimulated.  Large mucous plug suctioned from oropharynx.  CPAP initiated with quick transition to PPV with noted HR decline.  Gradual improvement in HR.  PPV stopped after ~1-58min.  Sao2 placed and in 60s; oxygen titrated.  CPAP continued.  Spontaneous breathing improved however WOB noted with high fio2 requirement.  Unable to wean after of CPAP. Lungs coarse and equal.  Ap 2/8.  Support person, "Aunt" and mother updated regarding her need for NICU admission.  She accompanied [place] to NICU.    Dineen Kid Leary Roca, MD Neonatologist 11/19/2018, 12:44 AM

## 2018-11-19 NOTE — Addendum Note (Signed)
Addendum  created 11/19/18 1645 by Graciela Husbands, CRNA   Clinical Note Signed

## 2018-11-19 NOTE — Progress Notes (Signed)
POSTPARTUM PROGRESS NOTE  POD #1  Subjective:  Nichole Patterson is a 28 y.o. [id number] s/p pLTCS at [redacted]w[redacted]d.  She reports she doing well. No acute events overnight. She denies any problems with ambulating, voiding or po intake. Denies nausea or vomiting. She has  passed flatus. Pain is well controlled.  Lochia is appropriate.  Objective: Blood pressure 117/72, pulse 74, temperature 98.4 F (36.9 C), temperature source Oral, resp. rate 18, height 5\' 3"  (1.6 m), weight 84.4 kg, last menstrual period 02/07/2018, SpO2 100 %.  Physical Exam:  General: alert, cooperative and no distress Chest: no respiratory distress Heart:regular rate, distal pulses intact Abdomen: soft, nontender,  Uterine Fundus: firm, appropriately tender DVT Evaluation: No calf swelling or tenderness Extremities: No LE edema Skin: warm, dry; incision clean/dry/intact w/ honeycomb dressing in place  Recent Labs    11/17/18 0943 11/19/18 0617  HGB 9.2* 9.0*  HCT 30.1* 28.8*    Assessment/Plan: Nichole Patterson is a 28 y.o. [id number] s/p pLTCS at [redacted]w[redacted]d for failure to progress.  POD#1 - Doing welll; pain well controlled. H/H appropriate  Routine postpartum care  OOB, ambulated  Lovenox for VTE prophylaxis Anemia: asymptomatic  Start po ferrous sulfate BID Baby in NICU.  Contraception: IUD  Feeding: Breast   Dispo: Plan for discharge POD #2 or #3.   LOS: 2 days   Marcy Siren, D.O. OB Fellow  11/19/2018, 7:38 AM

## 2018-11-20 LAB — COMPREHENSIVE METABOLIC PANEL
ALBUMIN: 2.4 g/dL — AB (ref 3.5–5.0)
ALT: 26 U/L (ref 0–44)
AST: 29 U/L (ref 15–41)
Alkaline Phosphatase: 112 U/L (ref 38–126)
Anion gap: 5 (ref 5–15)
BUN: 11 mg/dL (ref 6–20)
CO2: 24 mmol/L (ref 22–32)
Calcium: 8.2 mg/dL — ABNORMAL LOW (ref 8.9–10.3)
Chloride: 108 mmol/L (ref 98–111)
Creatinine, Ser: 0.87 mg/dL (ref 0.44–1.00)
GFR calc Af Amer: >60 mL/min (ref >60)
GFR calc non Af Amer: >60 mL/min (ref >60)
Glucose, Bld: 97 mg/dL (ref 70–99)
Potassium: 4.2 mmol/L (ref 3.5–5.1)
Sodium: 137 mmol/L (ref 135–145)
Total Bilirubin: 0.8 mg/dL (ref 0.3–1.2)
Total Protein: 5.5 g/dL — ABNORMAL LOW (ref 6.5–8.1)

## 2018-11-20 MED ORDER — IBUPROFEN 600 MG PO TABS
600.0000 mg | ORAL_TABLET | Freq: Four times a day (QID) | ORAL | Status: DC
  Administered 2018-11-20 – 2018-11-21 (×3): 600 mg via ORAL
  Filled 2018-11-20 (×3): qty 1

## 2018-11-20 NOTE — Progress Notes (Signed)
RN notified by Lab that patient refused 0500 blood work. Pt educated on need for blood work and importance of blood work. Pt stated" yall have stuck me enough." Pt also stated "I didn't care about blood work." Pt reeducated on need for blood work due to previous lab values pt still refused. MD to be notified.   Darrick Grinder RN

## 2018-11-20 NOTE — Progress Notes (Signed)
Post Partum/Post Op Day 2  Subjective: no complaints, up ad lib, voiding, tolerating PO and + flatus  Objective: Blood pressure 104/68, pulse 79, temperature 98.6 F (37 C), temperature source Oral, resp. rate 18, height 5\' 3"  (1.6 m), weight 84.4 kg, last menstrual period 02/07/2018, SpO2 100 %.  Physical Exam:  General: alert, cooperative and mild distress Lochia: appropriate Uterine Fundus: firm Incision: healing well, no significant drainage, no dehiscence, no significant erythema DVT Evaluation: No evidence of DVT seen on physical exam. Negative Homan's sign. No cords or calf tenderness. No significant calf/ankle edema.  Recent Labs    11/19/18 0617  HGB 9.0*  HCT 28.8*    Assessment/Plan: Discharge home and Contraception IUD at outpatient f/u appt  Needs BP check at follow up appt.   LOS: 3 days   Nichole Patterson 11/20/2018, 10:26 AM

## 2018-11-20 NOTE — Clinical Social Work Maternal (Signed)
CLINICAL SOCIAL WORK MATERNAL/CHILD NOTE  Patient Details  Name: Nichole Patterson MRN: [id number] Date of Birth: [birth year]  Date:  11/20/2018  Clinical Social Worker Initiating Note:  Laurey Arrow Date/Time: Initiated:  11/20/18/1129     Child's Name:  Nichole Patterson   Biological Parents:  Mother, Father(FOB is Gwynneth Aliment 04/04/1992; lives in [place])   Need for Interpreter:  None   Reason for Referral:  Homelessness, Late or No Prenatal Care (Late PNC at 46 weeks)   Address:  [place] [address]    Phone number:  [phone] (home)     Additional phone number:   Household Members/Support Persons (HM/SP):   Household Member/Support Person 1, Household Member/Support Person 2(MOB resides at [facility] [place]. Per MOB, MOB's oldest child resides with MOB's mother in [place]; CPS was not inolved. )   HM/SP Name Relationship DOB or Age  HM/SP -1 [place] Clubb(Lives with MOB's mother De'Cheryl Vazques in [place] [phone])./) son 10/09/11  HM/SP -2 Lodema Parma daughter 10/06/2012  HM/SP -3        HM/SP -4        HM/SP -5        HM/SP -6        HM/SP -7        HM/SP -8          Natural Supports (not living in the home):  Other (Comment), Friends, Parent(staff at [facility]. )   Professional Supports: Case Metallurgist, Shelter   Employment: Unemployed   Type of Work:     Education:      Homebound arranged:    Museum/gallery curator Resources:  Medicaid   Other Resources:  [employer], Physicist, medical    Cultural/Religious Considerations Which May Impact Care:    Strengths:  Ability to meet basic needs , Home prepared for child , Pediatrician chosen   Psychotropic Medications:         Pediatrician:    Careers adviser area  Pediatrician List:   [facility] ([address])  [place]      Pediatrician Fax Number:    Risk  Factors/Current Problems:  Other (Comment)(Homelessness (residing at [facility] [place]).)   Cognitive State:  Able to Concentrate , Alert , Linear Thinking , Insightful    Mood/Affect:  Interested , Happy , Relaxed , Comfortable    CSW Assessment: CSW met with MOB in room 131 to complete an assessment for late [facility] and questionable custody of older children. When CSW arrived, MOB was resting in bed bonding with infant as evidence by engaging in infant massages.  MOB's friend Crissie Sickles [birth year]/08[birth year]97) was also present and with MOB's permission, CSW asked MOB's friend to leave in order to meet with MOB in private.  MOB was polite, easy to engage, and receptive to meeting with CSW.   CSW asked about MOB's currently situation and MOB acknowledged living at [facility] [place] and having all essential items for infant. MOB reported not having any concerns regarding housing due to [facility] [place] assistance.   CSW asked about MOB's late [facility] and MOB reported that MOB did not initiated [facility] until moved to [facility] (05/2018).  MOB reported care continued to be delayed due to a lack of insurance coverage.  MOB reported currently having Medicaid and reported no barriers  to follow-up appointments for MOB and infant. CSW explained hospital's policy regarding late/limited PNC. CSW made MOB aware that infant's UDS was negative and that CSW will continue to monitor infant's CDS and will make a report to [place] if warranted.  MOB denied the use of all illicit substance an expressed not being concerned regarding drug screens. MOB denied CPS hx and reported that custody of MOB's oldest son did not involve CPS (CSW contact [place] and there are no concerns).  MOB reported having all essential items for infant and feels prepared to parent.   CSW Plan/Description:  No Further Intervention Required/No Barriers to Discharge, Other Patient/Family Education, [place],  CSW Will Continue to Monitor Umbilical Cord Tissue Drug Screen Results and Make Report if Warranted   Laurey Arrow, MSW, LCSW Clinical Social Work [phone]   Dimple Nanas, LCSW 11/20/2018, 11:52 AM

## 2018-11-20 NOTE — Progress Notes (Signed)
Pt refused Lovenox injection. Pt educated on reason for use and importance. Pt stated" your not injecting me with anything." She also said "ya'll just like to inject people with stuff." Pt again educated on importance and reason for use. Pt still refused medication.  Darrick Grinder RN

## 2018-11-21 ENCOUNTER — Encounter (HOSPITAL_COMMUNITY): Payer: Self-pay | Admitting: Obstetrics and Gynecology

## 2018-11-21 ENCOUNTER — Inpatient Hospital Stay (HOSPITAL_COMMUNITY)
Admission: RE | Admit: 2018-11-21 | Discharge: 2018-11-21 | Disposition: A | Discharge status: Home / Self Care | Payer: Medicaid Other | Source: Ambulatory Visit | Attending: Family Medicine | Admitting: Family Medicine

## 2018-11-21 MED ORDER — ONDANSETRON HCL 4 MG PO TABS: 4.0000 mg | ORAL_TABLET | Freq: Three times a day (TID) | ORAL | 0 refills | Status: AC | PRN

## 2018-11-21 MED ORDER — OXYCODONE HCL 5 MG PO TABS: 5.0000 mg | ORAL_TABLET | Freq: Four times a day (QID) | ORAL | 0 refills | Status: AC | PRN

## 2018-11-21 MED ORDER — ONDANSETRON HCL 4 MG PO TABS
4.0000 mg | ORAL_TABLET | Freq: Three times a day (TID) | ORAL | Status: DC | PRN
  Administered 2018-11-21: 4 mg via ORAL
  Filled 2018-11-21: qty 1

## 2018-11-21 NOTE — Discharge Summary (Addendum)
OB Discharge Summary     Patient Name: Nichole Patterson DOB: [birth year] MRN: [id number]  Date of admission: 11/17/2018 Delivering MD: Arvilla Market   Date of discharge: 11/21/2018  Admitting diagnosis: 41 WKS DECREASED FETAL MOVEMENT PRESSURE PAIN A LOT OF TIGHTNESS IN STOMACH Intrauterine pregnancy: [redacted]w[redacted]d     Secondary diagnosis:  Active Problems:   Decreased fetal movement Failure to dilate - arrested at 8cm Acute Kidney Injury, resolved  Additional problems: Homelessness, Limited/Late PNC at 31 weeks     Discharge diagnosis: Term Pregnancy Delivered                                                                                                Post partum procedures:None  Augmentation: Pitocin and Cytotec  Complications: Arrest of dilation at 8cm with decreased fetal movement, pLTCS performed  Hospital course:  Onset of Labor With Unplanned C/S  28 y.o. yo G3P2002 at [redacted]w[redacted]d was admitted for Induction of Labor for decreased fetal movement at [redacted]w[redacted]d in the MAU on 11/17/2018. Patient had a labor course significant for arrest of dilation at 8cm with decreased fetal movement. Membrane Rupture Time/Date: 4:00 AM ,11/18/2018   The patient went for cesarean section due to Arrest of Dilation, and delivered a Viable female infant,11/18/2018  Details of operation can be found in separate operative note. Patient had an uncomplicated postpartum course.  She is ambulating,tolerating a regular diet, passing flatus, and urinating well.  Patient is discharged home in stable condition 11/21/18.  Physical exam  Vitals:   11/20/18 0522 11/20/18 1529 11/20/18 2245 11/21/18 0611  BP: 104/68 124/74 124/70 121/82  Pulse: 79 79 74 72  Resp: 18 17 17    Temp: 98.6 F (37 C) 99.1 F (37.3 C) (!) 97.5 F (36.4 C) 97.7 F (36.5 C)  TempSrc: Oral Oral Oral   SpO2: 100% 100% 98% 100%  Weight:      Height:       General: alert, cooperative and no distress Lochia: appropriate Uterine Fundus:  firm Incision: Healing well with no significant drainage, No significant erythema, Dressing is clean, dry, and intact, patient experiencing minimal discomfort at rest that is more pronounced with ambulation DVT Evaluation: No evidence of DVT seen on physical exam. 2+ non-pitting edema to bilateral lower extremities (primarily dorsal feet) Labs: Lab Results  Component Value Date   WBC 32.5 (H) 11/19/2018   HGB 9.0 (L) 11/19/2018   HCT 28.8 (L) 11/19/2018   MCV 80.2 11/19/2018   PLT 263 11/19/2018   CMP Latest Ref Rng & Units 11/20/2018  Glucose 70 - 99 mg/dL 97  BUN 6 - 20 mg/dL 11  Creatinine 4.40 - 1.02 mg/dL 7.25  Sodium 366 - 440 mmol/L 137  Potassium 3.5 - 5.1 mmol/L 4.2  Chloride 98 - 111 mmol/L 108  CO2 22 - 32 mmol/L 24  Calcium 8.9 - 10.3 mg/dL 8.2(L)  Total Protein 6.5 - 8.1 g/dL 3.4(V)  Total Bilirubin 0.3 - 1.2 mg/dL 0.8  Alkaline Phos 38 - 126 U/L 112  AST 15 - 41 U/L 29  ALT 0 - 44 U/L 26  Discharge instruction: per After Visit Summary and "Baby and Me Booklet".  After visit meds:  Allergies as of 11/21/2018      Reactions   Latex Itching   Redness      Medication List    STOP taking these medications   COMFORT FIT MATERNITY SUPP MED Misc   cyclobenzaprine 10 MG tablet Commonly known as:  FLEXERIL     TAKE these medications   multivitamin-prenatal 27-0.8 MG Tabs tablet Take 1 tablet by mouth daily at 12 noon.   ondansetron 4 MG tablet Commonly known as:  ZOFRAN Take 1 tablet (4 mg total) by mouth every 8 (eight) hours as needed for up to 4 days for nausea or vomiting.   oxyCODONE 5 MG immediate release tablet Commonly known as:  Oxy IR/ROXICODONE Take 1 tablet (5 mg total) by mouth every 6 (six) hours as needed for up to 3 days for moderate pain.       Diet: low salt diet to help decreased edema  Activity: Advance as tolerated. Pelvic rest for 6 weeks.   Outpatient follow up:2 weeks Follow up Appt: Future Appointments  Date Time  Provider Department Center  11/29/2018 10:30 AM [facility] NURSE [facility] None  01/02/2019  1:30 PM Raelyn Moraawson, Rolitta, CNM [facility] None   Follow up Visit:No follow-ups on file.  Postpartum contraception: IUD, possible BTL  Newborn Data: Live born female  Birth Weight: 8 lb 0.8 oz (3650 g) APGAR: 2, 8  Newborn Delivery   Birth date/time:  11/18/2018 23:51:00 Delivery type:  C-Section, Low Transverse Trial of labor:  No C-section categorization:  Primary     Baby Feeding: Breast Disposition:home with mother   11/21/2018 Dollene ClevelandHannah C Kanon Colunga, DO    I personally was present during the history, physical exam and medical decision-making activities of this service and have verified that the service and findings are accurately documented in the student's note.   No barriers to discharge per LCSW note. Patient ambulating independently. Incision visualized prior to dressing change this morning. Previous dressing saturated during patient shower. Incision is well-approximated, dry, no erythema or drainage. Firm fundus, small lochia, passing flatus, pain well-managed with prescribed medications. Patient bonding with infant at time of Provider visit via gentle touch and providing care.   Clayton BiblesSamantha Weinhold Certified Nurse Midwife Faculty Practice 11/21/18  11:26 AM

## 2018-11-22 ENCOUNTER — Encounter (HOSPITAL_COMMUNITY): Payer: Self-pay | Admitting: *Deleted

## 2018-11-28 ENCOUNTER — Ambulatory Visit: Payer: Medicaid Other | Admitting: *Deleted

## 2018-11-28 VITALS — BP 148/96 | HR 81 | Temp 98.4°F | Ht 63.0 in | Wt 165.8 lb

## 2018-11-28 DIAGNOSIS — O165 Unspecified maternal hypertension, complicating the puerperium: Secondary | ICD-10-CM

## 2018-11-28 MED ORDER — AMLODIPINE BESYLATE 5 MG PO TABS: 5.0000 mg | ORAL_TABLET | Freq: Every day | ORAL | 0 refills | Status: DC

## 2018-11-28 NOTE — Progress Notes (Signed)
   Subjective:     Nichole Patterson is a 28 y.o. female who presents to the clinic 1 weeks status post low transverse c-section for decreased fetal movement at 6852w3d. Eating a regular diet without difficulty. Bowel movements are normal. The patient is not having any pain.  The following portions of the patient's history were reviewed and updated as appropriate: allergies, current medications, past medical history, past surgical history and problem list.  Review of Systems Pertinent items are noted in HPI.    Objective:    BP (!) 148/96 (BP Location: Left Arm, Patient Position: Sitting, Cuff Size: Normal)   Pulse 81   Temp 98.4 F (36.9 C) (Oral)   Ht 5\' 3"  (1.6 m)   Wt 165 lb 12.8 oz (75.2 kg)   LMP 02/07/2018 (Exact Date)   SpO2 99%   Breastfeeding No   BMI 29.37 kg/m  General:  alert, cooperative, appears stated age and no distress  Abdomen: soft, non-tender  Incision:   healing well, no drainage, no erythema, no hernia, no seroma, no swelling, no dehiscence, incision well approximated     Assessment:    Doing well postoperatively.   Plan:    1. Continue any current medications and start with Norvasc 5 mg once daily. 2. Wound care discussed. 3. Activity restrictions: none 4. Anticipated return to work: not applicable. 5. Follow up: 4 day for blood pressure check 6. Pre-eclampsia labs drawn today.  Clovis PuMartin, Adrean Findlay L, RN

## 2018-11-29 ENCOUNTER — Ambulatory Visit: Payer: Medicaid Other

## 2018-11-29 LAB — COMPREHENSIVE METABOLIC PANEL
ALBUMIN: 3.9 g/dL (ref 3.9–5.0)
ALT: 19 IU/L (ref 0–32)
AST: 15 IU/L (ref 0–40)
Albumin/Globulin Ratio: 1.4 (ref 1.2–2.2)
Alkaline Phosphatase: 122 IU/L — ABNORMAL HIGH (ref 39–117)
BUN/Creatinine Ratio: 9 (ref 9–23)
BUN: 10 mg/dL (ref 6–20)
Bilirubin Total: 0.3 mg/dL (ref 0.0–1.2)
CO2: 18 mmol/L — ABNORMAL LOW (ref 20–29)
Calcium: 9.9 mg/dL (ref 8.7–10.2)
Chloride: 105 mmol/L (ref 96–106)
Creatinine, Ser: 1.1 mg/dL — ABNORMAL HIGH (ref 0.57–1.00)
GFR calc non Af Amer: 69 mL/min/{1.73_m2} (ref >59)
GFR, EST AFRICAN AMERICAN: 80 mL/min/{1.73_m2} (ref >59)
Globulin, Total: 2.8 g/dL (ref 1.5–4.5)
Glucose: 80 mg/dL (ref 65–99)
POTASSIUM: 4.9 mmol/L (ref 3.5–5.2)
Sodium: 142 mmol/L (ref 134–144)
Total Protein: 6.7 g/dL (ref 6.0–8.5)

## 2018-11-29 LAB — PROTEIN / CREATININE RATIO, URINE
Creatinine, Urine: 96.2 mg/dL
Protein, Ur: 29.7 mg/dL
Protein/Creat Ratio: 309 mg/g creat — ABNORMAL HIGH (ref 0–200)

## 2018-11-30 ENCOUNTER — Other Ambulatory Visit: Payer: Self-pay | Admitting: Obstetrics and Gynecology

## 2018-11-30 DIAGNOSIS — O165 Unspecified maternal hypertension, complicating the puerperium: Secondary | ICD-10-CM

## 2018-11-30 MED ORDER — ENALAPRIL MALEATE 5 MG PO TABS: 5.0000 mg | ORAL_TABLET | Freq: Every day | ORAL | 0 refills | Status: DC

## 2018-11-30 NOTE — Progress Notes (Signed)
Consult with Dr. Jolayne Panther @ [phone] - notified of patient's reason for admission prior to delivery, VS from 11/28/2018, lab results, started on Norvasc 5 mg daily, and F/U scheduled on Monday 12/02/2018.   Recommended tx plan add Vasotec 5 mg daily to her regimen then keep BP recheck appt on Monday 12/02/2018.  Rx sent. Attempt made to contact patient by phone --> no answer and no msg left. Will send message to RN.  Raelyn Mora, CNM

## 2018-12-02 ENCOUNTER — Ambulatory Visit (INDEPENDENT_AMBULATORY_CARE_PROVIDER_SITE_OTHER): Payer: Medicaid Other | Admitting: *Deleted

## 2018-12-02 ENCOUNTER — Other Ambulatory Visit: Payer: Self-pay

## 2018-12-02 VITALS — BP 122/88 | HR 70 | Temp 98.1°F | Wt 161.2 lb

## 2018-12-02 DIAGNOSIS — O165 Unspecified maternal hypertension, complicating the puerperium: Secondary | ICD-10-CM

## 2018-12-02 DIAGNOSIS — Z0130 Encounter for examination of blood pressure without abnormal findings: Secondary | ICD-10-CM

## 2018-12-02 NOTE — Progress Notes (Signed)
   Subjective:  Nichole Patterson is a 28 y.o. female here for BP check.   Hypertension ROS: taking medications as instructed, no medication side effects noted, patient does not perform home BP monitoring, possible neurological symptoms headache, relieved with Tylenol, no chest pain on exertion, no dyspnea on exertion, noting swelling of ankles, no orthostatic dizziness or lightheadedness and no palpitations.    Objective:  BP 122/88 (BP Location: Right Arm, Patient Position: Sitting, Cuff Size: Normal)   Pulse 70   Temp 98.1 F (36.7 C) (Oral)   Wt 161 lb 3.2 oz (73.1 kg)   SpO2 100%   Breastfeeding No   BMI 28.56 kg/m   Appearance alert, well appearing, and in no distress, oriented to person, place, and time and normal appearing weight. General exam BP noted to be well controlled today in office.   Today's Vitals   12/02/18 1035 12/02/18 1105  BP: (!) 142/89 122/88  Pulse: 70 70  Temp: 98.1 F (36.7 C)   TempSrc: Oral   SpO2: 100%   Weight: 161 lb 3.2 oz (73.1 kg)    Body mass index is 28.56 kg/m.   Assessment:   Blood Pressure stable, no significant medication side effects noted and needs further observation.   Plan:  Reviewed diet, exercise and weight control. Recommended sodium restriction. Reviewed medications and side effects in detail. Follow up: 1 week and as needed. Repeat blood pressure check.  Clovis Pu, RN

## 2018-12-06 ENCOUNTER — Ambulatory Visit: Payer: Medicaid Other

## 2018-12-09 ENCOUNTER — Ambulatory Visit: Payer: Medicaid Other

## 2019-01-02 ENCOUNTER — Ambulatory Visit (INDEPENDENT_AMBULATORY_CARE_PROVIDER_SITE_OTHER): Payer: Medicaid Other | Admitting: Obstetrics and Gynecology

## 2019-01-02 ENCOUNTER — Encounter: Payer: Self-pay | Admitting: General Practice

## 2019-01-02 ENCOUNTER — Encounter: Payer: Self-pay | Admitting: Obstetrics and Gynecology

## 2019-01-02 VITALS — BP 117/73 | HR 78 | Temp 98.3°F | Wt 161.2 lb

## 2019-01-02 DIAGNOSIS — Z30014 Encounter for initial prescription of intrauterine contraceptive device: Secondary | ICD-10-CM

## 2019-01-02 DIAGNOSIS — Z30430 Encounter for insertion of intrauterine contraceptive device: Secondary | ICD-10-CM | POA: Diagnosis not present

## 2019-01-02 DIAGNOSIS — Z1389 Encounter for screening for other disorder: Secondary | ICD-10-CM | POA: Diagnosis not present

## 2019-01-02 DIAGNOSIS — Z3009 Encounter for other general counseling and advice on contraception: Secondary | ICD-10-CM

## 2019-01-02 MED ORDER — PARAGARD INTRAUTERINE COPPER IU IUD
INTRAUTERINE_SYSTEM | Freq: Once | INTRAUTERINE | Status: AC
  Administered 2019-01-02: 15:00:00 via INTRAUTERINE

## 2019-01-02 NOTE — Progress Notes (Signed)
   Post Partum Exam  Nichole Patterson is a 28 y.o. [id number] female who presents for a postpartum visit. She is 8 weeks postpartum following a low cervical transverse Cesarean section. I have fully reviewed the prenatal and intrapartum course. The delivery was at 41 gestational weeks.  Anesthesia: epidural. Postpartum course has been uncomplicated. Baby's course has been uncomplicated. Baby is feeding by bottle - Carnation Good Start Soy. Bleeding thick, heavy lochia. Period just started. Bowel function is normal. Bladder function is normal. Patient is not sexually active. Contraception method is none. Postpartum depression screening:neg  The following portions of the patient's history were reviewed and updated as appropriate: allergies, current medications, past family history, past medical history, past social history, past surgical history and problem list. Last pap smear done 09/25/2018 and was Normal  Review of Systems Constitutional: negative Eyes: negative Ears, nose, mouth, throat, and face: negative Respiratory: negative Cardiovascular: negative Gastrointestinal: negative Genitourinary:negative Integument/breast: negative Hematologic/lymphatic: negative Musculoskeletal:negative Neurological: negative Behavioral/Psych: negative Endocrine: negative Allergic/Immunologic: negative    Objective:  not currently breastfeeding.  General:  alert, cooperative and no distress   Breasts:  inspection negative, no nipple discharge or bleeding, no masses or nodularity palpable  Lungs: clear to auscultation bilaterally  Heart:  regular rate and rhythm, S1, S2 normal, no murmur, click, rub or gallop  Abdomen: soft, non-tender; bowel sounds normal; no masses,  no organomegaly   Vulva:  normal  Vagina: normal vagina  Cervix:  anteverted  Corpus: normal size, contour, position, consistency, mobility, non-tender  Adnexa:  normal adnexa  Rectal Exam: Not performed.  C/S scar well-healed, non-tender,  no s/sx's of infection, no diastasis recti palpated.  Patient identified, informed consent performed, signed copy in chart, time out was performed.  Urine pregnancy test negative.  Speculum placed in the vagina. Cervix visualized. Cleaned with Betadine x 2. Grasped anteriourly with a single tooth tenaculum.  Uterus sounded to 7 cm.  Paragard IUD placed per manufacturer's recommendations.  Strings trimmed to 3 cm.   Patient tolerated the procedure well. Patient given post procedure instructions and Paragard care card with expiration date. Patient is asked to check IUD strings periodically and follow up in 4 weeks for IUD check.  Assessment:    Normal postpartum exam. Pap smear not done at today's visit.   Plan:   1. Contraception: IUD 2. Follow up in: 1 month for IUD strings or as needed.   Edd Arbour, SNM   I confirm that I have verified the information documented in the nurse midwife student's note and that I have also personally reperformed the history, physical exam and all medical decision making activities of this service and have verified that all service and findings are accurately documented in this student's note.    Raelyn Mora, CNM 01/04/2019 12:14 PM

## 2019-01-02 NOTE — Patient Instructions (Signed)

## 2019-01-27 ENCOUNTER — Telehealth: Payer: Self-pay | Admitting: General Practice

## 2019-01-27 NOTE — Telephone Encounter (Signed)
Unable to reach pt via phone in regards to appt scheduled for 01/30/2019.  Mychart message sent to pt.

## 2019-01-30 ENCOUNTER — Ambulatory Visit: Payer: Medicaid Other | Admitting: Obstetrics and Gynecology

## 2019-02-12 ENCOUNTER — Telehealth: Payer: Self-pay | Admitting: General Practice

## 2019-02-12 ENCOUNTER — Ambulatory Visit: Payer: Medicaid Other | Admitting: Obstetrics and Gynecology

## 2019-02-12 NOTE — Telephone Encounter (Signed)
Called patient to check in for PP visit via Telehealth.  No answer;asked pt to give our office a call.  Mychart message sent to pt as well.

## 2019-11-25 IMAGING — US US MFM OB COMP +14 WKS
1 series · 13 of 28 positions shown · non-contrast
Comparison: none

[Series 1: us mfm ob comp +14 wks · 44 acquisitions, 13 frames shown]
[im 2/44]
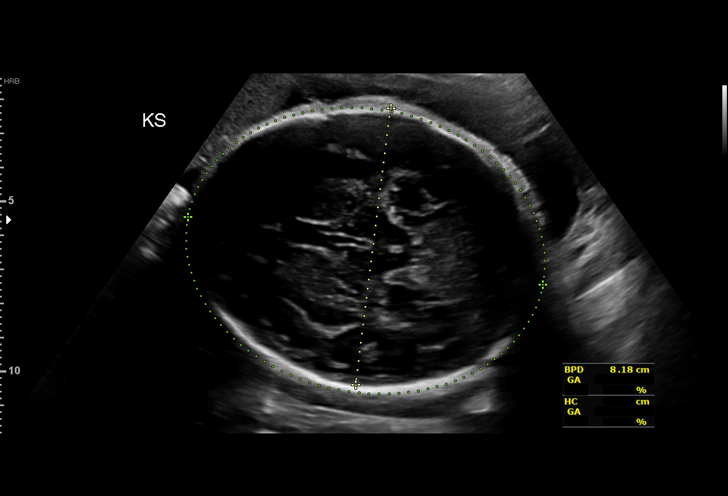
[im 5/44]
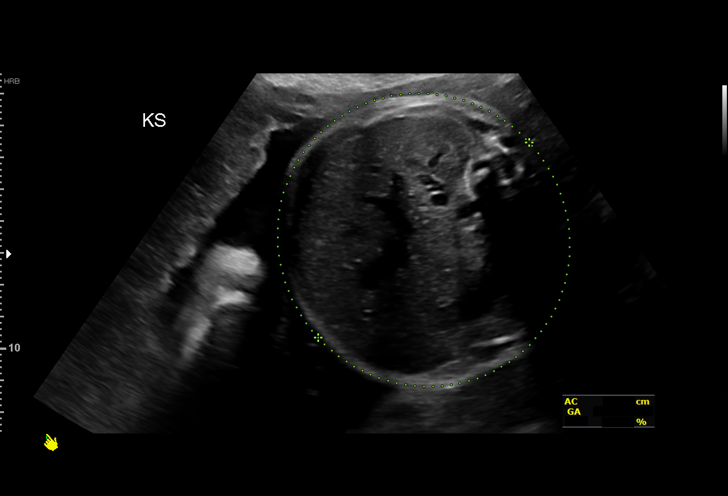
[im 8/44]
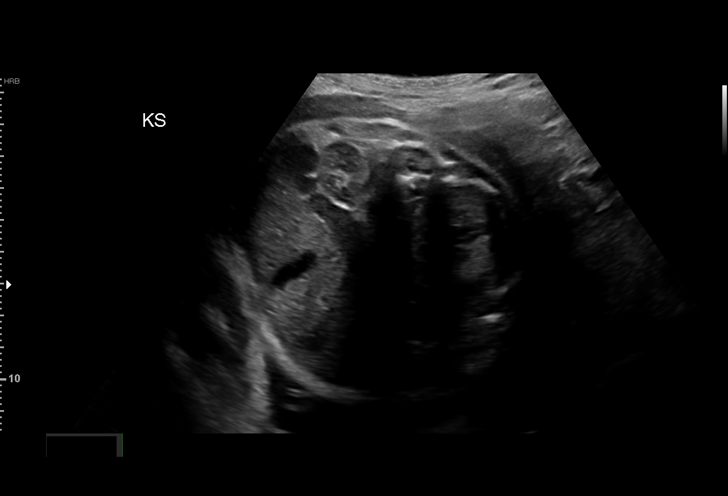
[im 12/44]
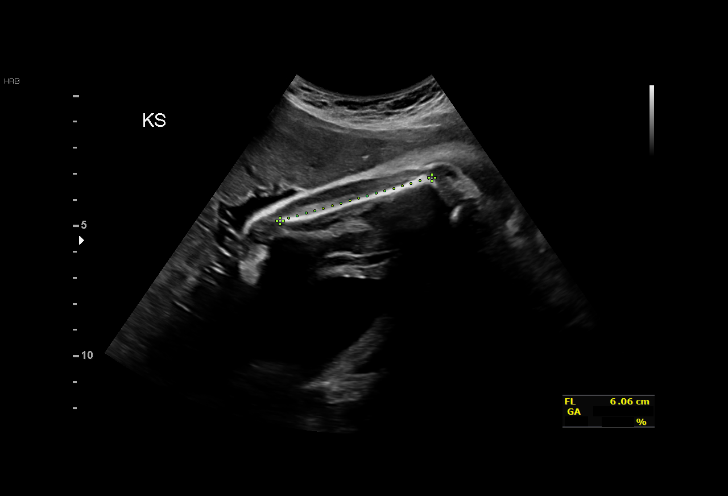
[im 15/44]
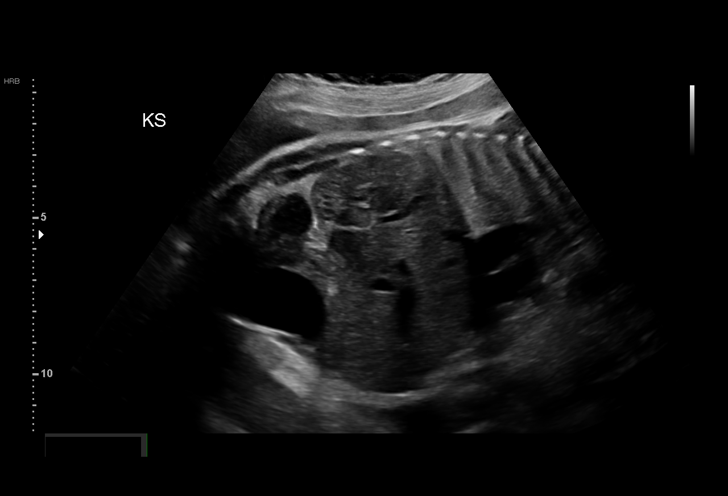
[im 18/44]
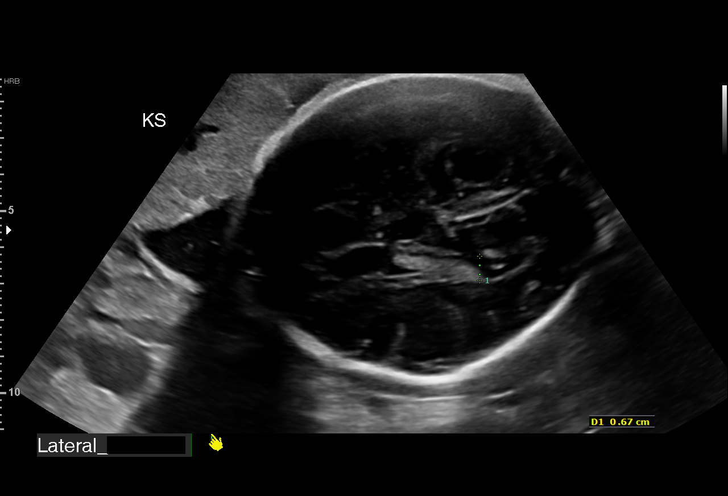
[im 23/44]
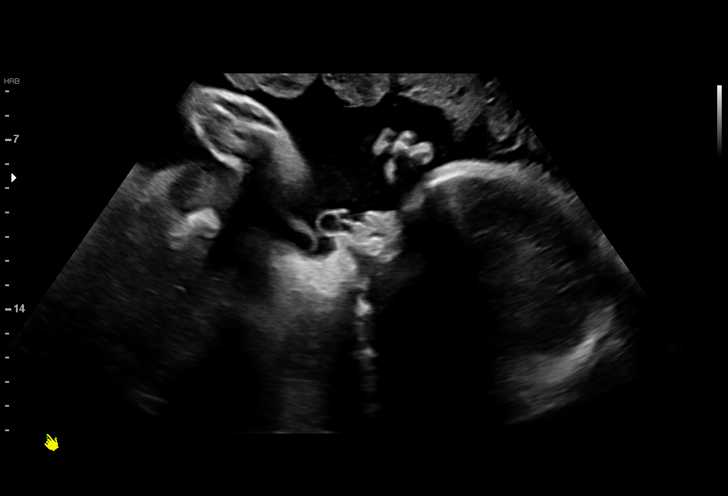
[im 26/44]
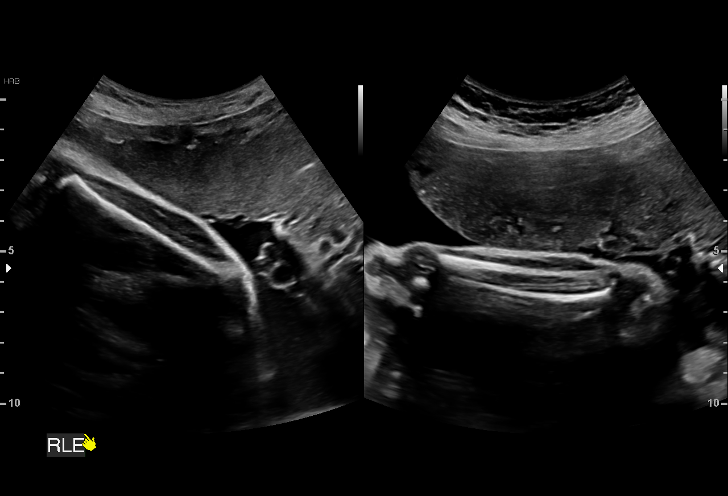
[im 29/44]
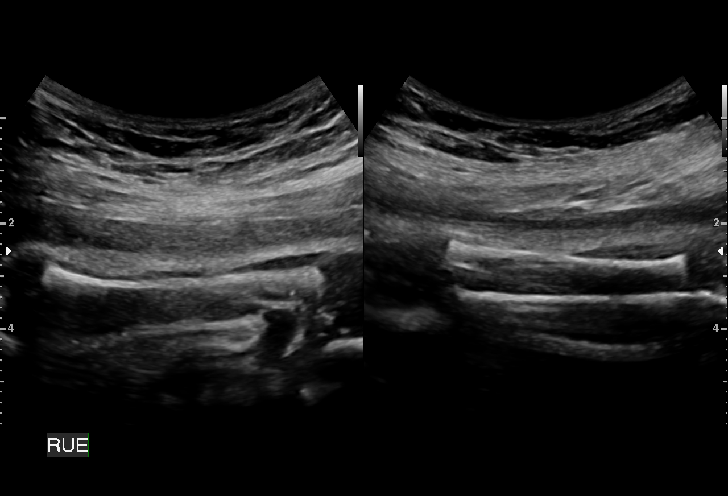
[im 32/44]
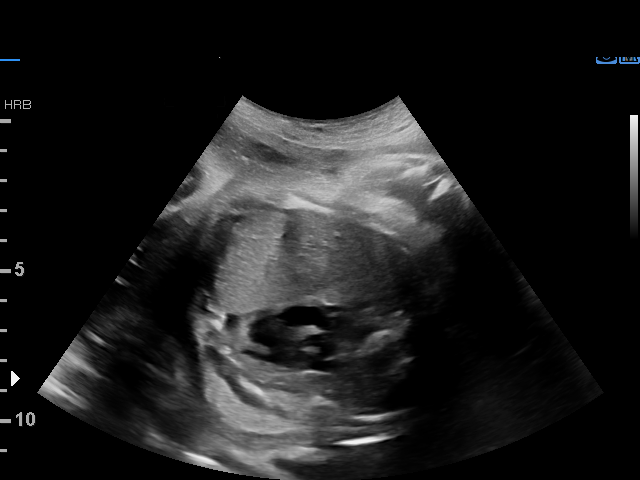
[im 36/44]
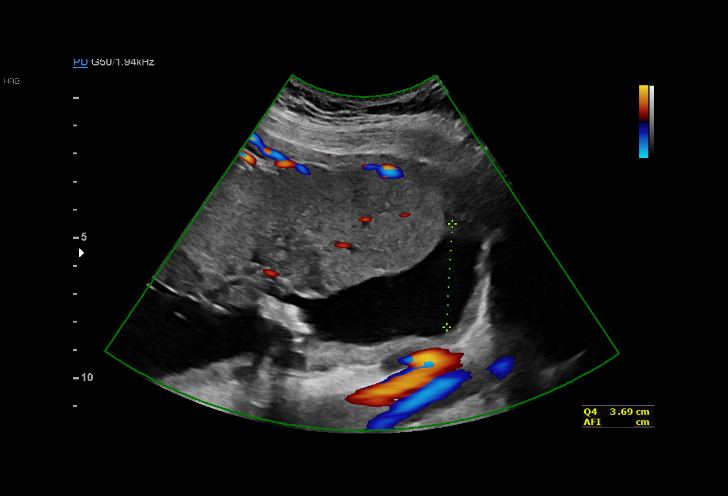
[im 39/44]
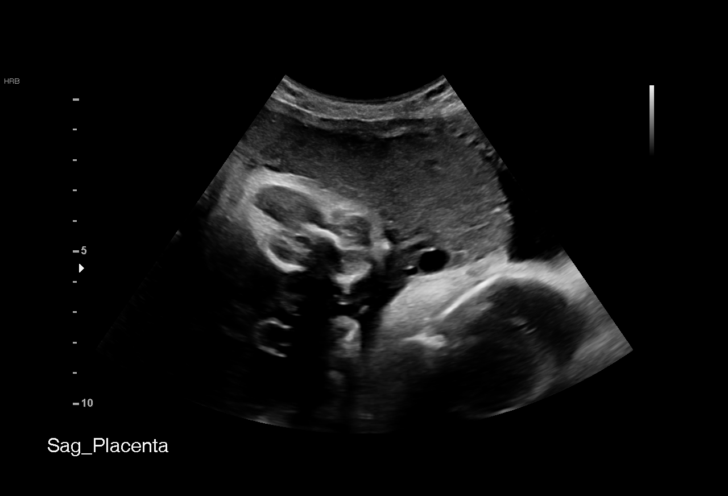
[im 42/44]
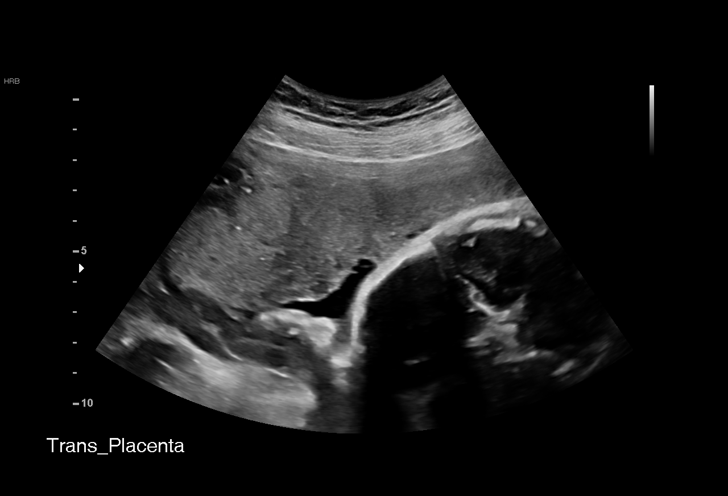

[13 of 28 positions shown; findings below may reference images not displayed]

Attending:        Yuko Mass        Secondary Phy.:   [facility]-
                                                            MAU/Triage
                   CLAIBORNE CNM

  1  US MFM OB COMP + 14 WK               76805.01     PIERRETTE BELK
 ----------------------------------------------------------------------

 ----------------------------------------------------------------------
Indications

  Encounter for antenatal screening for
  malformations
  32 weeks gestation of pregnancy
  Late prenatal care, second trimester
 ----------------------------------------------------------------------
Vital Signs

 BMI:
Fetal Evaluation

 Num Of Fetuses:         1
 Cardiac Activity:       Observed
 Presentation:           Cephalic
 Placenta:               Anterior
 P. Cord Insertion:      Previously Visualized

 Amniotic Fluid
 AFI FV:      Within normal limits

 AFI Sum(cm)     %Tile       Largest Pocket(cm)
 12.48           35

 RUQ(cm)       RLQ(cm)       LUQ(cm)        LLQ(cm)
 5.77          3.69          0
Biometry

 BPD:      81.4  mm     G. Age:  32w 5d         59  %    CI:        77.17   %    70 - 86
                                                         FL/HC:      20.7   %    19.1 -
 HC:      293.4  mm     G. Age:  32w 3d         20  %    HC/AC:      1.00        0.96 -
 AC:      294.4  mm     G. Age:  33w 3d         82  %    FL/BPD:     74.7   %    71 - 87
 FL:       60.8  mm     G. Age:  31w 4d         23  %    FL/AC:      20.7   %    20 - 24
 HUM:      54.2  mm     G. Age:  31w 4d         39  %

 Est. FW:    3935  gm      4 lb 8 oz     68  %
OB History

 Gravidity:    3         Term:   2        Prem:   0
 Living:       2
Gestational Age

 LMP:           32w 1d        Date:  02/07/18                 EDD:   11/14/18
 U/S Today:     32w 4d                                        EDD:   11/11/18
 Best:          32w 1d     Det. By:  LMP  (02/07/18)          EDD:   11/14/18
Anatomy

 Cranium:               Appears normal         Aortic Arch:            Previously seen
 Cavum:                 Appears normal         Ductal Arch:            Not well visualized
 Ventricles:            Appears normal         Diaphragm:              Appears normal
 Choroid Plexus:        Previously seen        Stomach:                Appears normal, left
                                                                       sided
 Cerebellum:            Previously seen        Abdomen:                Appears normal
 Posterior Fossa:       Previously seen        Abdominal Wall:         Previously seen
 Face:                  Orbits and profile     Cord Vessels:           Previously seen
                        previously seen
 Lips:                  Previously seen        Kidneys:                Appear normal
 Thoracic:              Appears normal         Bladder:                Appears normal
 Heart:                 Previously seen        Spine:                  Previously seen
 RVOT:                  Previously seen        Upper Extremities:      Appears normal
 LVOT:                  Previously seen        Lower Extremities:      Appears normal

 Other:  Fetus appears to be female. Heels not seen seen. 5th digit visualized.
         Nasal bone visualized. Open hands visualized.
Cervix Uterus Adnexa

 Cervix
 Not visualized (advanced GA >68wks)
Impression

 Late prenatal care. Patient had a limited ultrasound in the
 second trimester when she presented to [facility] with c/o
 vaginal bleeding.

 Fetal growth is appropriate for gestational age. Amniotic fluid
 is normal and good fetal activity is seen. Fetal anatomy
 appears normal, but limited by advanced gestational age.
 I reviewed the images of previous ultrasound and normal fetal
 anatomical structures seen were incorporated in my current
 report.

 We reassured the patient of the findings.
Recommendations

 Follow-up as clinically indicated.
                 Kramer, Luchy

## 2019-12-08 ENCOUNTER — Encounter: Payer: Self-pay | Admitting: General Practice

## 2021-09-28 ENCOUNTER — Emergency Department (HOSPITAL_COMMUNITY)
Admission: EM | Admit: 2021-09-28 | Discharge: 2021-09-28 | Disposition: A | Discharge status: Home / Self Care | Payer: Medicaid Other | Attending: Emergency Medicine | Admitting: Emergency Medicine

## 2021-09-28 ENCOUNTER — Encounter (HOSPITAL_COMMUNITY): Payer: Self-pay

## 2021-09-28 DIAGNOSIS — Z5321 Procedure and treatment not carried out due to patient leaving prior to being seen by health care provider: Secondary | ICD-10-CM | POA: Insufficient documentation

## 2021-09-28 DIAGNOSIS — Z202 Contact with and (suspected) exposure to infections with a predominantly sexual mode of transmission: Secondary | ICD-10-CM | POA: Insufficient documentation

## 2021-09-28 NOTE — ED Notes (Signed)
Pt eloped.

## 2021-09-28 NOTE — ED Provider Notes (Signed)
Emergency Medicine Provider Triage Evaluation Note  Nichole Patterson , a 30 y.o. female  was evaluated in triage.  Pt would like to be tested for STDs.  No known exposure.  No vaginal symptoms or urinary symptoms.  Review of Systems  All negative  Physical Exam  BP 131/84 (BP Location: Left Arm)   Pulse 67   Temp 98.9 F (37.2 C) (Oral)   Resp 18   LMP 09/28/2021 (Approximate)   SpO2 98%  Gen:   Awake, no distress   Resp:  Normal effort  MSK:   Moves extremities without difficulty  Other:    Medical Decision Making  Medically screening exam initiated at 5:04 PM.  Appropriate orders placed.  Nichole Patterson was informed that the remainder of the evaluation will be completed by another provider, this initial triage assessment does not replace that evaluation, and the importance of remaining in the ED until their evaluation is complete.     Nichole Benders, PA-C 09/28/21 1704    [name], MD 09/28/21 [phone]

## 2021-09-28 NOTE — ED Triage Notes (Signed)
Pt presents with c/o possible exposure to STD. Pt has no symptoms and does not have anyone that she has been with that she knows for sure is positive but just wants to make sure she does not have anything.

## 2021-09-28 NOTE — ED Provider Notes (Signed)
Patient left before I saw her. I didn't establish care with her    [name], MD 09/28/21 Serena Croissant

## 2021-11-11 ENCOUNTER — Ambulatory Visit: Payer: Medicaid Other | Admitting: Obstetrics and Gynecology

## 2022-05-24 ENCOUNTER — Encounter (HOSPITAL_COMMUNITY): Payer: Self-pay | Admitting: Obstetrics & Gynecology

## 2022-05-24 ENCOUNTER — Inpatient Hospital Stay (HOSPITAL_COMMUNITY)
Admission: AD | Admit: 2022-05-24 | Discharge: 2022-05-24 | Disposition: A | Discharge status: Home / Self Care | Payer: Medicaid Other | Attending: Obstetrics & Gynecology | Admitting: Obstetrics & Gynecology

## 2022-05-24 ENCOUNTER — Inpatient Hospital Stay (HOSPITAL_COMMUNITY): Payer: Medicaid Other

## 2022-05-24 ENCOUNTER — Other Ambulatory Visit: Payer: Self-pay

## 2022-05-24 DIAGNOSIS — Z3201 Encounter for pregnancy test, result positive: Secondary | ICD-10-CM | POA: Diagnosis not present

## 2022-05-24 DIAGNOSIS — O26891 Other specified pregnancy related conditions, first trimester: Secondary | ICD-10-CM | POA: Diagnosis not present

## 2022-05-24 DIAGNOSIS — R1010 Upper abdominal pain, unspecified: Secondary | ICD-10-CM | POA: Insufficient documentation

## 2022-05-24 DIAGNOSIS — Z3A10 10 weeks gestation of pregnancy: Secondary | ICD-10-CM

## 2022-05-24 DIAGNOSIS — R824 Acetonuria: Secondary | ICD-10-CM | POA: Insufficient documentation

## 2022-05-24 DIAGNOSIS — O219 Vomiting of pregnancy, unspecified: Secondary | ICD-10-CM

## 2022-05-24 LAB — CBC
HCT: 38.1 % (ref 36.0–46.0)
Hemoglobin: 12.1 g/dL (ref 12.0–15.0)
MCH: 24.9 pg — ABNORMAL LOW (ref 26.0–34.0)
MCHC: 31.8 g/dL (ref 30.0–36.0)
MCV: 78.4 fL — ABNORMAL LOW (ref 80.0–100.0)
Platelets: 272 10*3/uL (ref 150–400)
RBC: 4.86 MIL/uL (ref 3.87–5.11)
RDW: 16.1 % — ABNORMAL HIGH (ref 11.5–15.5)
WBC: 11.1 10*3/uL — ABNORMAL HIGH (ref 4.0–10.5)
nRBC: 0 % (ref 0.0–0.2)

## 2022-05-24 LAB — RAPID URINE DRUG SCREEN, HOSP PERFORMED
Amphetamines: NOT DETECTED
Barbiturates: NOT DETECTED
Benzodiazepines: NOT DETECTED
Cocaine: NOT DETECTED
Opiates: NOT DETECTED
Tetrahydrocannabinol: POSITIVE — AB

## 2022-05-24 LAB — URINALYSIS, ROUTINE W REFLEX MICROSCOPIC
Bilirubin Urine: NEGATIVE
Glucose, UA: NEGATIVE mg/dL
Hgb urine dipstick: NEGATIVE
Ketones, ur: 5 mg/dL — AB
Leukocytes,Ua: NEGATIVE
Nitrite: NEGATIVE
Protein, ur: NEGATIVE mg/dL
Specific Gravity, Urine: 1.028 (ref 1.005–1.030)
pH: 6 (ref 5.0–8.0)

## 2022-05-24 LAB — BASIC METABOLIC PANEL
Anion gap: 13 (ref 5–15)
BUN: 12 mg/dL (ref 6–20)
CO2: 22 mmol/L (ref 22–32)
Calcium: 10.3 mg/dL (ref 8.9–10.3)
Chloride: 100 mmol/L (ref 98–111)
Creatinine, Ser: 0.62 mg/dL (ref 0.44–1.00)
GFR, Estimated: >60 mL/min (ref >60)
Glucose, Bld: 100 mg/dL — ABNORMAL HIGH (ref 70–99)
Potassium: 4.3 mmol/L (ref 3.5–5.1)
Sodium: 135 mmol/L (ref 135–145)

## 2022-05-24 LAB — POCT PREGNANCY, URINE: Preg Test, Ur: POSITIVE — AB

## 2022-05-24 LAB — HCG, QUANTITATIVE, PREGNANCY: hCG, Beta Chain, Quant, S: 208403 m[IU]/mL — ABNORMAL HIGH (ref <5)

## 2022-05-24 MED ORDER — FAMOTIDINE IN NACL 20-0.9 MG/50ML-% IV SOLN
20.0000 mg | Freq: Once | INTRAVENOUS | Status: AC
  Administered 2022-05-24: 20 mg via INTRAVENOUS
  Filled 2022-05-24: qty 50

## 2022-05-24 MED ORDER — PROMETHAZINE HCL 25 MG PO TABS
12.5000 mg | ORAL_TABLET | Freq: Once | ORAL | Status: AC
  Administered 2022-05-24: 12.5 mg via ORAL
  Filled 2022-05-24: qty 1

## 2022-05-24 MED ORDER — SCOPOLAMINE 1 MG/3DAYS TD PT72
1.0000 | MEDICATED_PATCH | TRANSDERMAL | Status: DC
  Administered 2022-05-24: 1.5 mg via TRANSDERMAL
  Filled 2022-05-24: qty 1

## 2022-05-24 MED ORDER — LACTATED RINGERS IV BOLUS
1000.0000 mL | Freq: Once | INTRAVENOUS | Status: AC
  Administered 2022-05-24: 1000 mL via INTRAVENOUS

## 2022-05-24 MED ORDER — VITAFOL GUMMIES 3.33-0.333-34.8 MG PO CHEW: 1.0000 | CHEWABLE_TABLET | Freq: Every day | ORAL | 5 refills | Status: AC

## 2022-05-24 MED ORDER — PROMETHAZINE HCL 12.5 MG PO TABS: 12.5000 mg | ORAL_TABLET | Freq: Four times a day (QID) | ORAL | 0 refills | Status: DC | PRN

## 2022-05-24 MED ORDER — FAMOTIDINE 20 MG PO TABS: 20.0000 mg | ORAL_TABLET | Freq: Two times a day (BID) | ORAL | 0 refills | Status: DC

## 2022-05-24 MED ORDER — ONDANSETRON HCL 4 MG/2ML IJ SOLN
4.0000 mg | Freq: Once | INTRAMUSCULAR | Status: AC
  Administered 2022-05-24: 4 mg via INTRAVENOUS
  Filled 2022-05-24: qty 2

## 2022-05-24 NOTE — MAU Note (Signed)
Nichole Patterson is a 31 y.o. at Unknown here in MAU reporting: can't keep anything down today.  Has been having occ nausea/vomiting, got worse the last few days.  Is feeling very weak. Is preg, has not been seen yet. +HPT end of June. Pain in upper abd.  Denies bleeding.  LMP: ? May Onset of complaint: last few  days Pain score: 6 Vitals:   05/24/22 1721  BP: 129/83  Pulse: 100  Resp: 20  Temp: 97.8 F (36.6 C)  SpO2: 100%      Lab orders placed from triage:UPT, UA if +

## 2022-05-24 NOTE — Discharge Instructions (Signed)

## 2022-05-24 NOTE — MAU Provider Note (Signed)
History     CSN: [id number]  Arrival date and time: 05/24/22 1706  Event Date/Time  First Provider Initiated Contact with Patient 05/24/22 1747     Chief Complaint  Patient presents with   Emesis   Abdominal Pain   Possible Pregnancy   Extremity Weakness   HPI Nichole Patterson is a 31 y.o. [phone] in early pregnancy who presents to MAU with chief complaints of emesis, abdominal pain and generalized weakness in the setting of possible pregnancy. These are new problems, onset within the past few days and worsening significantly today. She endorses pain across the top of her abdomen. She is remote from [facility] use. She denies decreased urine output, vaginal bleeding, dysuria, fever or recent illness.  Patient is [facility] at a women's shelter and requests hard copy prescriptions.  OB History     Gravida  4   Para  3   Term  3   Preterm  0   AB  0   Living  3      SAB      IAB      Ectopic      Multiple  0   Live Births  3           Past Medical History:  Diagnosis Date   Asthma    Chlamydia    Headache    Heart murmur    Trichomonas contact     Past Surgical History:  Procedure Laterality Date   CESAREAN SECTION N/A 11/18/2018   Procedure: CESAREAN SECTION;  Surgeon: [place] Bing, MD;  Location: [facility]IRTH[facility] SUITES;  Service: Obstetrics;  Laterality: N/A;   MOUTH SURGERY      Family History  Problem Relation Age of Onset   Pneumonia Father     Social History   Tobacco Use   Smoking status: Never   Smokeless tobacco: Never  Vaping Use   Vaping Use: Never used  Substance Use Topics   Alcohol use: Never   Drug use: Never    Allergies:  Allergies  Allergen Reactions   Latex Itching    Redness    Medications Prior to Admission  Medication Sig Dispense Refill Last Dose   amLODipine (NORVASC) 5 MG tablet Take 1 tablet (5 mg total) by mouth daily. 30 tablet 0    enalapril (VASOTEC) 5 MG tablet Take 1 tablet (5 mg total) by mouth daily.  (Patient not taking: Reported on 12/02/2018) 30 tablet 0    Prenatal Vit-Fe Fumarate-FA (MULTIVITAMIN-PRENATAL) 27-0.8 MG TABS tablet Take 1 tablet by mouth daily at 12 noon.       Review of Systems  Constitutional:  Positive for fatigue.  Gastrointestinal:  Positive for abdominal pain, nausea and vomiting.  All other systems reviewed and are negative.  Physical Exam   Blood pressure 129/83, pulse 100, temperature 97.8 F (36.6 C), temperature source Axillary, resp. rate 20, height 5\' 3"  (1.6 m), weight 64.3 kg, SpO2 100 %.  Physical Exam Vitals and nursing note reviewed. Exam conducted with a chaperone present.  Constitutional:      Appearance: She is well-developed.  HENT:     Mouth/Throat:     Mouth: Mucous membranes are moist.  Cardiovascular:     Rate and Rhythm: Normal rate.  Pulmonary:     Effort: Pulmonary effort is normal.  Abdominal:     Palpations: Abdomen is soft.  Skin:    General: Skin is warm.     Capillary Refill: Capillary refill takes less than 2  seconds.  Neurological:     Mental Status: She is alert and oriented to person, place, and time.  Psychiatric:        Mood and Affect: Mood normal.        Behavior: Behavior normal.     MAU Course  Procedures  MDM  --Remote from THC use, + THC on UDS collected in current encounter. If recurrent vomiting, advised Haldol + Benadryl as treatment for Cannabis Hyperemesis.   Orders Placed This Encounter  Procedures   US OB Comp Less 14 Wks   Urinalysis, Routine w reflex microscopic Urine, Clean Catch   CBC   Basic metabolic panel   hCG, quantitative, pregnancy   Rapid urine drug screen (hospital performed)   Pregnancy, urine POC   Insert peripheral IV   Discharge patient   Patient Vitals for the past 24 hrs:  BP Temp Temp src Pulse Resp SpO2 Height Weight  05/24/22 1721 129/83 97.8 F (36.6 C) Axillary 100 20 100 % 5\' 3"  (1.6 m) 64.3 kg   Results for orders placed or performed during the hospital  encounter of 05/24/22 (from the past 24 hour(s))  Pregnancy, urine POC     Status: Abnormal   Collection Time: 05/24/22  5:33 PM  Result Value Ref Range   Preg Test, Ur POSITIVE (A) NEGATIVE  Urinalysis, Routine w reflex microscopic     Status: Abnormal   Collection Time: 05/24/22  5:36 PM  Result Value Ref Range   Color, Urine YELLOW YELLOW   APPearance HAZY (A) CLEAR   Specific Gravity, Urine 1.028 1.005 - 1.030   pH 6.0 5.0 - 8.0   Glucose, UA NEGATIVE NEGATIVE mg/dL   Hgb urine dipstick NEGATIVE NEGATIVE   Bilirubin Urine NEGATIVE NEGATIVE   Ketones, ur 5 (A) NEGATIVE mg/dL   Protein, ur NEGATIVE NEGATIVE mg/dL   Nitrite NEGATIVE NEGATIVE   Leukocytes,Ua NEGATIVE NEGATIVE  Rapid urine drug screen (hospital performed)     Status: Abnormal   Collection Time: 05/24/22  5:42 PM  Result Value Ref Range   Opiates NONE DETECTED NONE DETECTED   Cocaine NONE DETECTED NONE DETECTED   Benzodiazepines NONE DETECTED NONE DETECTED   Amphetamines NONE DETECTED NONE DETECTED   Tetrahydrocannabinol POSITIVE (A) NONE DETECTED   Barbiturates NONE DETECTED NONE DETECTED  CBC     Status: Abnormal   Collection Time: 05/24/22  6:05 PM  Result Value Ref Range   WBC 11.1 (H) 4.0 - 10.5 K/uL   RBC 4.86 3.87 - 5.11 MIL/uL   Hemoglobin 12.1 12.0 - 15.0 g/dL   HCT 05/26/22 70.6 - 23.7 %   MCV 78.4 (L) 80.0 - 100.0 fL   MCH 24.9 (L) 26.0 - 34.0 pg   MCHC 31.8 30.0 - 36.0 g/dL   RDW 62.8 (H) 31.5 - 17.6 %   Platelets 272 150 - 400 K/uL   nRBC 0.0 0.0 - 0.2 %  Basic metabolic panel     Status: Abnormal   Collection Time: 05/24/22  6:05 PM  Result Value Ref Range   Sodium 135 135 - 145 mmol/L   Potassium 4.3 3.5 - 5.1 mmol/L   Chloride 100 98 - 111 mmol/L   CO2 22 22 - 32 mmol/L   Glucose, Bld 100 (H) 70 - 99 mg/dL   BUN 12 6 - 20 mg/dL   Creatinine, Ser 05/26/22 0.44 - 1.00 mg/dL   Calcium 7.37 8.9 - 10.6 mg/dL   GFR, Estimated 26.9 >48 mL/min   Anion gap  13 5 - 15  hCG, quantitative, pregnancy      Status: Abnormal   Collection Time: 05/24/22  6:05 PM  Result Value Ref Range   hCG, Beta Chain, Quant, S 208,403 (H) <5 mIU/mL   US OB Comp Less 14 Wks  Result Date: 05/24/2022 CLINICAL DATA:  Upper abdominal pain. Quantitative beta HCG is in progress. Estimated gestational age by LMP is 15 weeks 1 day. EXAM: OBSTETRIC <14 WK ULTRASOUND TECHNIQUE: Transabdominal ultrasound was performed for evaluation of the gestation as well as the maternal uterus and adnexal regions. COMPARISON:  04/25/2022 FINDINGS: Intrauterine gestational sac: A single intrauterine pregnancy is demonstrated. Yolk sac:  Yolk sac is not visualized. Embryo:  Fetal pole is present. Cardiac Activity: Fetal cardiac activity is observed. Heart Rate: 164 bpm CRL:   39 mm   10 w 6 d                  [place] EDC: 12/14/2022 Subchorionic hemorrhage:  None visualized. Maternal uterus/adnexae: Uterus is anteverted. No myometrial mass lesions identified. Both ovaries are visualized and appear normal. Corpus luteal cyst suggested on the left. No abnormal free or loculated pelvic fluid. IMPRESSION: Single intrauterine pregnancy. Estimated gestational age by crown-rump length is 10 weeks 6 days. No acute complication is suggested sonographically. Electronically Signed   By: Burman Nieves M.D.   On: 05/24/2022 18:50    Meds ordered this encounter  Medications   lactated ringers bolus 1,000 mL   famotidine (PEPCID) IVPB 20 mg premix   ondansetron (ZOFRAN) injection 4 mg   promethazine (PHENERGAN) tablet 12.5 mg   scopolamine (TRANSDERM-SCOP) 1 MG/3DAYS 1.5 mg   Prenatal Vit-Fe Phos-FA-Omega (VITAFOL GUMMIES) 3.33-0.333-34.8 MG CHEW    Sig: Chew 1 tablet by mouth daily.    Dispense:  90 tablet    Refill:  5    Order Specific Question:   Supervising Provider    Answer:   Jaynie Collins A [[id number]]   promethazine (PHENERGAN) 12.5 MG tablet    Sig: Take 1 tablet (12.5 mg total) by mouth every 6 (six) hours as needed for nausea or vomiting.     Dispense:  30 tablet    Refill:  0    Order Specific Question:   Supervising Provider    Answer:   Jaynie Collins A [[id number]]   famotidine (PEPCID) 20 MG tablet    Sig: Take 1 tablet (20 mg total) by mouth 2 (two) times daily.    Dispense:  30 tablet    Refill:  0    Order Specific Question:   Supervising Provider    Answer:   Jaynie Collins A [[id number]]   Assessment and Plan  --31 y.o. [phone] with live IUP measuring 10w 6d --Mild ketonuria, s/p rehydration with IV fluids --No episodes of vomiting during MAU encounter --Tolerating PO solid intake prior to discharge --Discharge home in stable condition  Calvert Cantor, MSA, MSN, CNM 05/24/2022, 7:40 PM

## 2022-05-26 ENCOUNTER — Encounter (HOSPITAL_COMMUNITY): Payer: Self-pay | Admitting: Obstetrics and Gynecology

## 2022-05-26 ENCOUNTER — Inpatient Hospital Stay (HOSPITAL_COMMUNITY)
Admission: AD | Admit: 2022-05-26 | Discharge: 2022-05-26 | Disposition: A | Discharge status: Home / Self Care | Payer: Medicaid Other | Attending: Obstetrics and Gynecology | Admitting: Obstetrics and Gynecology

## 2022-05-26 DIAGNOSIS — Z3A11 11 weeks gestation of pregnancy: Secondary | ICD-10-CM | POA: Diagnosis not present

## 2022-05-26 DIAGNOSIS — O99321 Drug use complicating pregnancy, first trimester: Secondary | ICD-10-CM | POA: Insufficient documentation

## 2022-05-26 DIAGNOSIS — R112 Nausea with vomiting, unspecified: Secondary | ICD-10-CM | POA: Insufficient documentation

## 2022-05-26 DIAGNOSIS — F1290 Cannabis use, unspecified, uncomplicated: Secondary | ICD-10-CM | POA: Diagnosis not present

## 2022-05-26 MED ORDER — LACTATED RINGERS IV BOLUS
1000.0000 mL | Freq: Once | INTRAVENOUS | Status: AC
  Administered 2022-05-26: 1000 mL via INTRAVENOUS

## 2022-05-26 MED ORDER — METOCLOPRAMIDE HCL 5 MG/ML IJ SOLN
10.0000 mg | Freq: Once | INTRAMUSCULAR | Status: AC
  Administered 2022-05-26: 10 mg via INTRAVENOUS
  Filled 2022-05-26: qty 2

## 2022-05-26 MED ORDER — FAMOTIDINE 20 MG PO TABS: 20.0000 mg | ORAL_TABLET | Freq: Two times a day (BID) | ORAL | 3 refills | Status: DC

## 2022-05-26 MED ORDER — ONDANSETRON HCL 4 MG/2ML IJ SOLN
4.0000 mg | Freq: Once | INTRAMUSCULAR | Status: AC
  Administered 2022-05-26: 4 mg via INTRAVENOUS
  Filled 2022-05-26: qty 2

## 2022-05-26 MED ORDER — PROMETHAZINE HCL 12.5 MG PO TABS: 12.5000 mg | ORAL_TABLET | Freq: Four times a day (QID) | ORAL | 2 refills | Status: DC | PRN

## 2022-05-26 MED ORDER — SCOPOLAMINE 1 MG/3DAYS TD PT72
1.0000 | MEDICATED_PATCH | TRANSDERMAL | Status: DC
  Administered 2022-05-26: 1.5 mg via TRANSDERMAL
  Filled 2022-05-26: qty 1

## 2022-05-26 MED ORDER — ONDANSETRON 8 MG PO TBDP: 8.0000 mg | ORAL_TABLET | Freq: Three times a day (TID) | ORAL | 2 refills | Status: DC | PRN

## 2022-05-26 MED ORDER — SCOPOLAMINE 1 MG/3DAYS TD PT72: 1.0000 | MEDICATED_PATCH | TRANSDERMAL | 12 refills | Status: DC

## 2022-05-26 MED ORDER — METOCLOPRAMIDE HCL 10 MG PO TABS: 10.0000 mg | ORAL_TABLET | Freq: Three times a day (TID) | ORAL | 2 refills | Status: DC | PRN

## 2022-05-26 MED ORDER — PROCHLORPERAZINE EDISYLATE 10 MG/2ML IJ SOLN
10.0000 mg | Freq: Once | INTRAMUSCULAR | Status: AC
  Administered 2022-05-26: 10 mg via INTRAVENOUS
  Filled 2022-05-26: qty 2

## 2022-05-26 MED ORDER — HALOPERIDOL LACTATE 5 MG/ML IJ SOLN
5.0000 mg | Freq: Four times a day (QID) | INTRAMUSCULAR | Status: DC | PRN
  Administered 2022-05-26: 5 mg via INTRAVENOUS
  Filled 2022-05-26 (×2): qty 1

## 2022-05-26 MED ORDER — FAMOTIDINE IN NACL 20-0.9 MG/50ML-% IV SOLN
20.0000 mg | Freq: Once | INTRAVENOUS | Status: AC
  Administered 2022-05-26: 20 mg via INTRAVENOUS
  Filled 2022-05-26: qty 50

## 2022-05-26 NOTE — MAU Provider Note (Signed)
History     CSN: [id number]  Arrival date and time: 05/26/22 1841   Event Date/Time   First Provider Initiated Contact with Patient 05/26/22 1928      Chief Complaint  Patient presents with   Abdominal Pain   Emesis During Pregnancy   HPI  Nichole Patterson is a 31 y.o. [id number] at [redacted]w[redacted]d who presents for evaluation of nausea and vomiting. Patient reports she has tried to take her phenergan with no relief. She has been vomiting non stop since she was seen on 7/26. She last tried to take phenergan at 1500 and immediately vomited. She reports burning in her throat and cramping before she vomits. She denies any vaginal bleeding, discharge, and leaking of fluid. Denies any constipation, diarrhea or any urinary complaints.   OB History     Gravida  4   Para  3   Term  3   Preterm  0   AB  0   Living  3      SAB      IAB      Ectopic      Multiple  0   Live Births  3           Past Medical History:  Diagnosis Date   Asthma    Chlamydia    Headache    Heart murmur    Trichomonas contact     Past Surgical History:  Procedure Laterality Date   CESAREAN SECTION N/A 11/18/2018   Procedure: CESAREAN SECTION;  Surgeon: [place] Bing, MD;  Location: [facility] BIRTHING SUITES;  Service: Obstetrics;  Laterality: N/A;   MOUTH SURGERY      Family History  Problem Relation Age of Onset   Pneumonia Father     Social History   Tobacco Use   Smoking status: Never   Smokeless tobacco: Never  Vaping Use   Vaping Use: Never used  Substance Use Topics   Alcohol use: Never   Drug use: Never    Allergies:  Allergies  Allergen Reactions   Latex Itching    Redness    Medications Prior to Admission  Medication Sig Dispense Refill Last Dose   Prenatal Vit-Fe Phos-FA-Omega (VITAFOL GUMMIES) 3.33-0.333-34.8 MG CHEW Chew 1 tablet by mouth daily. 90 tablet 5 05/26/2022   promethazine (PHENERGAN) 12.5 MG tablet Take 1 tablet (12.5 mg total) by mouth every 6 (six) hours as  needed for nausea or vomiting. 30 tablet 0 05/26/2022   famotidine (PEPCID) 20 MG tablet Take 1 tablet (20 mg total) by mouth 2 (two) times daily. 30 tablet 0     Review of Systems  Constitutional: Negative.  Negative for fatigue and fever.  HENT: Negative.    Respiratory: Negative.  Negative for shortness of breath.   Cardiovascular: Negative.  Negative for chest pain.  Gastrointestinal:  Positive for nausea and vomiting. Negative for abdominal pain, constipation and diarrhea.  Genitourinary: Negative.  Negative for dysuria, vaginal bleeding and vaginal discharge.  Neurological: Negative.  Negative for dizziness and headaches.   Physical Exam   Blood pressure 127/85, pulse (!) 110, temperature 97.6 F (36.4 C), resp. rate (!) 22, height 5\' 3"  (1.6 m), weight 62.1 kg, SpO2 100 %.  Patient Vitals for the past 24 hrs:  BP Temp Pulse Resp SpO2 Height Weight  05/26/22 1925 127/85 -- (!) 110 -- 100 % -- --  05/26/22 1907 119/79 -- -- -- -- -- --  05/26/22 1906 -- 97.6 F (36.4 C) (!) 110 (!) 22  100 % 5\' 3"  (1.6 m) 62.1 kg    Physical Exam Vitals and nursing note reviewed.  Constitutional:      General: She is not in acute distress.    Appearance: She is well-developed.  HENT:     Head: Normocephalic.  Eyes:     Pupils: Pupils are equal, round, and reactive to light.  Cardiovascular:     Rate and Rhythm: Normal rate and regular rhythm.     Heart sounds: Normal heart sounds.  Pulmonary:     Effort: Pulmonary effort is normal. No respiratory distress.     Breath sounds: Normal breath sounds.  Abdominal:     General: Bowel sounds are normal. There is no distension.     Palpations: Abdomen is soft.     Tenderness: There is no abdominal tenderness.  Skin:    General: Skin is warm and dry.  Neurological:     Mental Status: She is alert and oriented to person, place, and time.  Psychiatric:        Mood and Affect: Mood normal.        Behavior: Behavior normal.        Thought  Content: Thought content normal.        Judgment: Judgment normal.     FHT: 172 bpm  MAU Course  Procedures  MDM Labs ordered and reviewed.   UA LR bolus Pepcid Zofran   Patient requested something to sleep. Patient drove self to hospital and has primary complaints of nausea and vomiting. CNM informed patient that she cannot be given sedative medication when driving herself.   Patient reports still feeling nauseous.   Compazine IV Scop patch  No episodes of vomiting but patient reports still feeling nauseous  Reglan IV Haldol IV  Patient reports feeling better with just a small amount of nausea. CNM discussed that improvement in symptoms is the goal, not resolution. Discussed importance of cessation of mariajuana to help with symptoms.   Options to help with sleep reviewed at length. CNM encouraged patient to take antiemetics on a schedule to help with symptoms.    Assessment and Plan   1. Cannabinoid hyperemesis syndrome   2. [redacted] weeks gestation of pregnancy     -Discharge home in stable condition -Rx for scop patches, reglan, zofran and refills for pepcid and phenergan sent to pharmacy -First trimester precautions discussed -Patient advised to follow-up with OB to establish prenatal care -Patient may return to MAU as needed or if her condition were to change or worsen  , CNM 05/26/2022, 7:28 PM

## 2022-05-26 NOTE — Discharge Instructions (Signed)

## 2022-05-26 NOTE — MAU Note (Signed)
.  Nichole Patterson is a 31 y.o. at [redacted]w[redacted]d here in MAU reporting: n/v and lower abd pain. Was seen 2 days ago for same thing. Taking phenergan but not helping. Hurts chest and head too when I throw up. Denies VB. Pain is crampy and sharp. Last BM last Sat  Onset of complaint: ongoing Pain score: 10 Vitals:   05/26/22 1906 05/26/22 1907  BP:  119/79  Pulse: (!) 110   Resp: (!) 22   Temp: 97.6 F (36.4 C)   SpO2: 100%      FHT:n/a Lab orders placed from triage:  u/a

## 2022-12-07 DIAGNOSIS — O4590 Premature separation of placenta, unspecified, unspecified trimester: Secondary | ICD-10-CM

## 2024-05-01 ENCOUNTER — Telehealth: Payer: Self-pay | Admitting: Family Medicine

## 2024-05-01 ENCOUNTER — Inpatient Hospital Stay (HOSPITAL_COMMUNITY)

## 2024-05-01 ENCOUNTER — Inpatient Hospital Stay (HOSPITAL_BASED_OUTPATIENT_CLINIC_OR_DEPARTMENT_OTHER)
Admission: EM | Admit: 2024-05-01 | Discharge: 2024-05-01 | Disposition: A | Discharge status: Home / Self Care | Attending: Family Medicine | Admitting: Family Medicine

## 2024-05-01 ENCOUNTER — Other Ambulatory Visit: Payer: Self-pay

## 2024-05-01 ENCOUNTER — Encounter (HOSPITAL_BASED_OUTPATIENT_CLINIC_OR_DEPARTMENT_OTHER): Payer: Self-pay

## 2024-05-01 DIAGNOSIS — Z3A01 Less than 8 weeks gestation of pregnancy: Secondary | ICD-10-CM

## 2024-05-01 DIAGNOSIS — O26891 Other specified pregnancy related conditions, first trimester: Secondary | ICD-10-CM | POA: Diagnosis not present

## 2024-05-01 DIAGNOSIS — R109 Unspecified abdominal pain: Secondary | ICD-10-CM

## 2024-05-01 DIAGNOSIS — R112 Nausea with vomiting, unspecified: Secondary | ICD-10-CM

## 2024-05-01 DIAGNOSIS — O219 Vomiting of pregnancy, unspecified: Secondary | ICD-10-CM | POA: Insufficient documentation

## 2024-05-01 LAB — COMPREHENSIVE METABOLIC PANEL WITH GFR
ALT: 8 U/L (ref 0–44)
AST: 20 U/L (ref 15–41)
Albumin: 4.8 g/dL (ref 3.5–5.0)
Alkaline Phosphatase: 81 U/L (ref 38–126)
Anion gap: 23 — ABNORMAL HIGH (ref 5–15)
BUN: 8 mg/dL (ref 6–20)
CO2: 15 mmol/L — ABNORMAL LOW (ref 22–32)
Calcium: 10.3 mg/dL (ref 8.9–10.3)
Chloride: 97 mmol/L — ABNORMAL LOW (ref 98–111)
Creatinine, Ser: 0.85 mg/dL (ref 0.44–1.00)
GFR, Estimated: >60 mL/min (ref >60)
Glucose, Bld: 130 mg/dL — ABNORMAL HIGH (ref 70–99)
Potassium: 3.5 mmol/L (ref 3.5–5.1)
Sodium: 134 mmol/L — ABNORMAL LOW (ref 135–145)
Total Bilirubin: 0.5 mg/dL (ref 0.0–1.2)
Total Protein: 8.2 g/dL — ABNORMAL HIGH (ref 6.5–8.1)

## 2024-05-01 LAB — CBC
HCT: 41.5 % (ref 36.0–46.0)
Hemoglobin: 13.6 g/dL (ref 12.0–15.0)
MCH: 27.3 pg (ref 26.0–34.0)
MCHC: 32.8 g/dL (ref 30.0–36.0)
MCV: 83.3 fL (ref 80.0–100.0)
Platelets: 297 10*3/uL (ref 150–400)
RBC: 4.98 MIL/uL (ref 3.87–5.11)
RDW: 13.8 % (ref 11.5–15.5)
WBC: 13.2 10*3/uL — ABNORMAL HIGH (ref 4.0–10.5)
nRBC: 0 % (ref 0.0–0.2)

## 2024-05-01 LAB — HCG, QUANTITATIVE, PREGNANCY: hCG, Beta Chain, Quant, S: 80562 m[IU]/mL — ABNORMAL HIGH (ref <5)

## 2024-05-01 MED ORDER — SCOPOLAMINE 1 MG/3DAYS TD PT72
1.0000 | MEDICATED_PATCH | TRANSDERMAL | Status: DC
  Administered 2024-05-01: 1.5 mg via TRANSDERMAL
  Filled 2024-05-01: qty 1

## 2024-05-01 MED ORDER — FAMOTIDINE 20 MG PO TABS
40.0000 mg | ORAL_TABLET | Freq: Every day | ORAL | Status: DC
  Administered 2024-05-01: 40 mg via ORAL
  Filled 2024-05-01: qty 2

## 2024-05-01 MED ORDER — PROCHLORPERAZINE EDISYLATE 10 MG/2ML IJ SOLN
10.0000 mg | Freq: Once | INTRAMUSCULAR | Status: AC
Start: 2024-05-01 — End: 2024-05-01
  Administered 2024-05-01: 10 mg via INTRAVENOUS
  Filled 2024-05-01: qty 2

## 2024-05-01 MED ORDER — SCOPOLAMINE 1 MG/3DAYS TD PT72: 1.0000 | MEDICATED_PATCH | TRANSDERMAL | 12 refills | Status: DC

## 2024-05-01 MED ORDER — FAMOTIDINE 40 MG PO TABS
40.0000 mg | ORAL_TABLET | Freq: Every day | ORAL | 3 refills | Status: AC
Start: 2024-05-02 — End: ?

## 2024-05-01 MED ORDER — SODIUM CHLORIDE 0.9 % IV BOLUS
1000.0000 mL | Freq: Once | INTRAVENOUS | Status: AC
  Administered 2024-05-01: 1000 mL via INTRAVENOUS

## 2024-05-01 MED ORDER — DIPHENHYDRAMINE HCL 50 MG/ML IJ SOLN
25.0000 mg | Freq: Once | INTRAMUSCULAR | Status: AC
Start: 2024-05-01 — End: 2024-05-01
  Administered 2024-05-01: 25 mg via INTRAVENOUS
  Filled 2024-05-01: qty 1

## 2024-05-01 MED ORDER — LACTATED RINGERS IV SOLN: INTRAVENOUS | Status: DC

## 2024-05-01 MED ORDER — ONDANSETRON HCL 4 MG/2ML IJ SOLN
4.0000 mg | Freq: Once | INTRAMUSCULAR | Status: AC
Start: 2024-05-01 — End: 2024-05-01
  Administered 2024-05-01: 4 mg via INTRAVENOUS
  Filled 2024-05-01: qty 2

## 2024-05-01 MED ORDER — LACTATED RINGERS IV BOLUS
1000.0000 mL | Freq: Once | INTRAVENOUS | Status: AC
  Administered 2024-05-01: 1000 mL via INTRAVENOUS

## 2024-05-01 MED ORDER — METOCLOPRAMIDE HCL 5 MG/ML IJ SOLN
10.0000 mg | Freq: Once | INTRAMUSCULAR | Status: AC
  Administered 2024-05-01: 10 mg via INTRAVENOUS
  Filled 2024-05-01: qty 2

## 2024-05-01 NOTE — ED Provider Notes (Signed)
 [facility]EMERGENCY DEPT [facility] Emergency Department Provider Note MRN:  [id number]  Arrival date & time: 05/01/24     Chief Complaint   Emesis   History of Present Illness   Nichole Patterson is a 33 y.o. year-old female with no pertinent past medical history presenting to the ED with chief complaint of emesis.  Persistent nausea vomiting and abdominal discomfort over the past week.  Pregnant had last period in late May.  Denies fever.  Review of Systems  A thorough review of systems was obtained and all systems are negative except as noted in the HPI and PMH.   Patient's Health History    Past Medical History:  Diagnosis Date   Asthma    Chlamydia    Headache    Heart murmur    Trichomonas contact     Past Surgical History:  Procedure Laterality Date   CESAREAN SECTION N/A 11/18/2018   Procedure: CESAREAN SECTION;  Surgeon: Izell Harari, MD;  Location: [facility] BIRTHING SUITES;  Service: Obstetrics;  Laterality: N/A;   MOUTH SURGERY      Family History  Problem Relation Age of Onset   Pneumonia Father     Social History   Socioeconomic History   Marital status: Single    Spouse name: Not on file   Number of children: 2   Years of education: some college   Highest education level: 12th grade  Occupational History   Occupation: Unemployed  Tobacco Use   Smoking status: Never   Smokeless tobacco: Never  Vaping Use   Vaping status: Never Used  Substance and Sexual Activity   Alcohol use: Never   Drug use: Never   Sexual activity: Yes    Birth control/protection: None  Other Topics Concern   Not on file  Social History Narrative   Not on file   Social Drivers of Health   Financial Resource Strain: Not on file  Food Insecurity: Not on file  Transportation Needs: Not on file  Physical Activity: Not on file  Stress: Not on file  Social Connections: Not on file  Intimate Partner Violence: Not on file     Physical Exam   Vitals:   05/01/24 0445  05/01/24 0545  BP: 135/88 115/80  Pulse: 99 64  Resp: 18 18  Temp:    SpO2: 100% 100%    CONSTITUTIONAL: Well-appearing, moderate distress due to pain NEURO/PSYCH:  Alert and oriented x 3, no focal deficits EYES:  eyes equal and reactive ENT/NECK:  no LAD, no JVD CARDIO: Regular rate, well-perfused, normal S1 and S2 PULM:  CTAB no wheezing or rhonchi GI/GU:  non-distended, non-tender MSK/SPINE:  No gross deformities, no edema SKIN:  no rash, atraumatic   *Additional and/or pertinent findings included in MDM below  Diagnostic and Interventional Summary    EKG Interpretation Date/Time:    Ventricular Rate:    PR Interval:    QRS Duration:    QT Interval:    QTC Calculation:   R Axis:      Text Interpretation:         Labs Reviewed  CBC - Abnormal; Notable for the following components:      Result Value   WBC 13.2 (*)    All other components within normal limits  COMPREHENSIVE METABOLIC PANEL WITH GFR - Abnormal; Notable for the following components:   Sodium 134 (*)    Chloride 97 (*)    CO2 15 (*)    Glucose, Bld 130 (*)  Total Protein 8.2 (*)    Anion gap 23 (*)    All other components within normal limits  HCG, QUANTITATIVE, PREGNANCY - Abnormal; Notable for the following components:   hCG, Beta Chain, Quant, S 80,562 (*)    All other components within normal limits  URINALYSIS, ROUTINE W REFLEX MICROSCOPIC    US  OB Comp < 14 Wks    (Results Pending)  US  OB Transvaginal    (Results Pending)    Medications  sodium chloride  0.9 % bolus 1,000 mL (0 mLs Intravenous Stopped 05/01/24 0437)  ondansetron  (ZOFRAN ) injection 4 mg (4 mg Intravenous Given 05/01/24 0336)  prochlorperazine  (COMPAZINE ) injection 10 mg (10 mg Intravenous Given 05/01/24 0442)  diphenhydrAMINE  (BENADRYL ) injection 25 mg (25 mg Intravenous Given 05/01/24 0441)  lactated ringers  bolus 1,000 mL (0 mLs Intravenous Stopped 05/01/24 0550)     Procedures  /  Critical Care Procedures  ED Course and  Medical Decision Making  Initial Impression and Ddx Patient appears uncomfortable, very nauseated, abdomen soft and nontender with no rebound guarding or rigidity.  Possibly hyperemesis gravidarum.  Providing symptomatic management, fluids, will reassess.  Past medical/surgical history that increases complexity of ED encounter: Really concerning  Interpretation of Diagnostics I personally reviewed the Laboratory Testing and my interpretation is as follows: Acidosis  Ultrasound pending  Patient Reassessment and Ultimate Disposition/Management     Given patient's early pregnancy and abdominal pain obtaining ultrasound to evaluate for IUP versus ectopic.  Patient appears more comfortable, resting, vitals normal.  Signed out to oncoming provider at shift change.  Patient management required discussion with the following services or consulting groups:  None  Complexity of Problems Addressed Acute illness or injury that poses threat of life of bodily function  Additional Data Reviewed and Analyzed Further history obtained from: Further history from spouse/family member  Additional Factors Impacting ED Encounter Risk Consideration of hospitalization  Ozell HERO. Theadore, MD [facility] [email or web address]  Final Clinical Impressions(s) / ED Diagnoses     ICD-10-CM   1. Abdominal pain, unspecified abdominal location  R10.9     2. Nausea and vomiting, unspecified vomiting type  R11.2       ED Discharge Orders     None        Discharge Instructions Discussed with and Provided to Patient:   Discharge Instructions   None      Theadore Ozell HERO, MD 05/01/24 [phone]

## 2024-05-01 NOTE — MAU Provider Note (Signed)
 History     CSN: [id number]  Arrival date and time: 05/01/24 0251   None     Chief Complaint  Patient presents with   Emesis   HPI Patient initially presented to med [facility] for nausea and vomiting for the last week.  Most recent period was in May.  She was given Zofran , Compazine , Benadryl  and IV fluids which all helped with her symptoms.  Because of her abdominal pain and ultrasound was recommended but there was no ultrasonographer until 5 tonight so she was transferred here for imaging.  On arrival she reports her nausea and vomiting are greatly improved.  OB History     Gravida  5   Para  4   Term  4   Preterm  0   AB  0   Living  4      SAB      IAB      Ectopic      Multiple  0   Live Births  3           Past Medical History:  Diagnosis Date   Asthma    Chlamydia    Headache    Heart murmur    Trichomonas contact     Past Surgical History:  Procedure Laterality Date   CESAREAN SECTION N/A 11/18/2018   Procedure: CESAREAN SECTION;  Surgeon: Izell Harari, MD;  Location: [facility] BIRTHING SUITES;  Service: Obstetrics;  Laterality: N/A;   MOUTH SURGERY      Family History  Problem Relation Age of Onset   Pneumonia Father     Social History   Tobacco Use   Smoking status: Never   Smokeless tobacco: Never  Vaping Use   Vaping status: Never Used  Substance Use Topics   Alcohol use: Never   Drug use: Never    Allergies:  Allergies  Allergen Reactions   Latex Itching    Redness    Medications Prior to Admission  Medication Sig Dispense Refill Last Dose/Taking   metoCLOPramide  (REGLAN ) 10 MG tablet Take 1 tablet (10 mg total) by mouth every 8 (eight) hours as needed for nausea. 30 tablet 2 05/01/2024   ondansetron  (ZOFRAN -ODT) 8 MG disintegrating tablet Take 1 tablet (8 mg total) by mouth every 8 (eight) hours as needed for nausea or vomiting. 30 tablet 2 05/01/2024   famotidine  (PEPCID ) 20 MG tablet Take 1 tablet (20 mg total)  by mouth 2 (two) times daily. 30 tablet 3    Prenatal Vit-Fe Phos-FA-Omega (VITAFOL  GUMMIES) 3.33-0.333-34.8 MG CHEW Chew 1 tablet by mouth daily. 90 tablet 5 More than a month   promethazine  (PHENERGAN ) 12.5 MG tablet Take 1 tablet (12.5 mg total) by mouth every 6 (six) hours as needed for nausea or vomiting. 30 tablet 2 More than a month   scopolamine  (TRANSDERM-SCOP) 1 MG/3DAYS Place 1 patch (1.5 mg total) onto the skin every 3 (three) days. 10 patch 12 More than a month    Review of Systems  Constitutional:  Negative for chills and fever.  Gastrointestinal:  Positive for abdominal pain, nausea and vomiting.  Genitourinary:  Negative for vaginal bleeding and vaginal discharge.   Physical Exam   Blood pressure 112/65, pulse 73, temperature 98.5 F (36.9 C), temperature source Oral, resp. rate 16, height 5' 7 (1.702 m), weight 81.6 kg, last menstrual period 03/24/2024, SpO2 100%, unknown if currently breastfeeding.  Physical Exam Vitals and nursing note reviewed.  Constitutional:      Appearance: Normal appearance.  HENT:     Head: Normocephalic and atraumatic.     Nose: No congestion or rhinorrhea.  Eyes:     Extraocular Movements: Extraocular movements intact.  Cardiovascular:     Rate and Rhythm: Normal rate.  Pulmonary:     Effort: Pulmonary effort is normal.  Abdominal:     Palpations: Abdomen is soft.     Tenderness: There is no abdominal tenderness.  Musculoskeletal:        General: Normal range of motion.     Cervical back: Normal range of motion.  Skin:    General: Skin is warm.     Capillary Refill: Capillary refill takes less than 2 seconds.  Neurological:     General: No focal deficit present.     Mental Status: She is alert.     Cranial Nerves: No cranial nerve deficit.  Psychiatric:        Mood and Affect: Mood normal.        Behavior: Behavior normal.     MAU Course  Procedures  MDM Ultrasound Scopolamine  Pepcid   Assessment and Plan  Nichole Patterson is a 33 yo G5P4004 @ [redacted]w[redacted]d presenting for nausea and vomiting in early pregnancy  Nausea and vomiting Patient was previously evaluated at [facility] emergency department and given Reglan , Compazine , Benadryl .  Reports these all helped with her nausea.  Unfortunately they could not get an ultrasound there so she was shipped here for further evaluation and management.  Ultrasound showing IUP measuring 6 weeks and 6 days.  This changed her due date.  Scopolamine  patch placed and Pepcid  given.  Patient reports she was feeling better and was tolerating p.o. liquids at the time of discharge.  Return precautions given.  Message sent to clinic to have her scheduled for initial prenatal visit.  No further questions or concerns.  Patient discharged home.  Nichole Patterson Nichole Patterson 05/01/2024, 12:43 PM

## 2024-05-01 NOTE — ED Triage Notes (Signed)
 Pt states she is pregnant and has been sick over a week.  N/V states she can not keep anything down.   + Abdominal pain

## 2024-05-01 NOTE — ED Provider Notes (Signed)
 Care transferred to me.  Patient is awaiting an OB ultrasound but I found out that our ultrasonographer will not be here until about 5 PM today.  Vitals are reassuring but she is still symptomatic from a nausea perspective and still having abdominal pain.  I think she will need an OB ultrasound more urgently to rule out ectopic but also supportive care for what seems to be a metabolic acidosis from hyperemesis.  Will continue fluids.  Discussed with Dr. Fredirick who accepts to the MAU.   Freddi Hamilton, MD 05/01/24 [phone]

## 2024-05-01 NOTE — ED Notes (Signed)
 Nausea improved, pt resting well

## 2024-05-01 NOTE — MAU Note (Signed)
 Nichole Patterson is a 33 y.o. at [redacted]w[redacted]d here in MAU reporting: transfer from [place] via EMS.  The pt is currently rating her stomach pain a 10 out of 10.  She states that she has not eaten solid food in a week and also has not had a stool in the past week. Complains of being dizzy with general weakness.  Onset of complaint: last week Pain score: 10 Vitals:   05/01/24 0802 05/01/24 1102  BP: 134/85 112/65  Pulse: 71 73  Resp: 18 16  Temp: 98.7 F (37.1 C) 98.5 F (36.9 C)  SpO2: 100% 100%

## 2024-05-01 NOTE — Telephone Encounter (Signed)
 Called patient to schedule a new ob intake voicemail was not an option called x3's

## 2024-05-01 NOTE — Discharge Instructions (Signed)
 It was great taking care of you today.  I sent a prescription for Pepcid  to your pharmacy which you can take daily for reflux as well as a scopolamine  patch which you wear each patch for 3 days and then change them out.  I will send a message to the clinic to have you scheduled for an initial prenatal visit.  If you have any issues or concerns please return for further evaluation.  I hope you have a great rest of your day!

## 2024-05-01 NOTE — ED Notes (Signed)
 Pt has not needed to void yet. Will recheck pt in a while. Pt is resting at this time

## 2024-05-10 ENCOUNTER — Inpatient Hospital Stay (HOSPITAL_BASED_OUTPATIENT_CLINIC_OR_DEPARTMENT_OTHER)
Admission: EM | Admit: 2024-05-10 | Discharge: 2024-05-16 | DRG: 833 | Disposition: A | Discharge status: Home / Self Care | Attending: Obstetrics and Gynecology | Admitting: Obstetrics and Gynecology

## 2024-05-10 ENCOUNTER — Encounter (HOSPITAL_COMMUNITY): Payer: Self-pay | Admitting: Obstetrics and Gynecology

## 2024-05-10 ENCOUNTER — Other Ambulatory Visit: Payer: Self-pay

## 2024-05-10 DIAGNOSIS — O99611 Diseases of the digestive system complicating pregnancy, first trimester: Secondary | ICD-10-CM | POA: Diagnosis present

## 2024-05-10 DIAGNOSIS — Z3A09 9 weeks gestation of pregnancy: Secondary | ICD-10-CM | POA: Diagnosis not present

## 2024-05-10 DIAGNOSIS — Z560 Unemployment, unspecified: Secondary | ICD-10-CM | POA: Diagnosis not present

## 2024-05-10 DIAGNOSIS — O211 Hyperemesis gravidarum with metabolic disturbance: Secondary | ICD-10-CM | POA: Diagnosis present

## 2024-05-10 DIAGNOSIS — O208 Other hemorrhage in early pregnancy: Secondary | ICD-10-CM | POA: Diagnosis present

## 2024-05-10 DIAGNOSIS — R9431 Abnormal electrocardiogram [ECG] [EKG]: Secondary | ICD-10-CM | POA: Diagnosis present

## 2024-05-10 DIAGNOSIS — O210 Mild hyperemesis gravidarum: Secondary | ICD-10-CM | POA: Diagnosis present

## 2024-05-10 DIAGNOSIS — R112 Nausea with vomiting, unspecified: Principal | ICD-10-CM

## 2024-05-10 DIAGNOSIS — Z3A08 8 weeks gestation of pregnancy: Secondary | ICD-10-CM

## 2024-05-10 DIAGNOSIS — K219 Gastro-esophageal reflux disease without esophagitis: Secondary | ICD-10-CM | POA: Diagnosis present

## 2024-05-10 DIAGNOSIS — E876 Hypokalemia: Secondary | ICD-10-CM | POA: Diagnosis present

## 2024-05-10 DIAGNOSIS — E860 Dehydration: Secondary | ICD-10-CM

## 2024-05-10 DIAGNOSIS — O468X9 Other antepartum hemorrhage, unspecified trimester: Secondary | ICD-10-CM | POA: Insufficient documentation

## 2024-05-10 DIAGNOSIS — Z91040 Latex allergy status: Secondary | ICD-10-CM | POA: Diagnosis not present

## 2024-05-10 DIAGNOSIS — K117 Disturbances of salivary secretion: Secondary | ICD-10-CM | POA: Diagnosis present

## 2024-05-10 LAB — COMPREHENSIVE METABOLIC PANEL WITH GFR
ALT: 14 U/L (ref 0–44)
AST: 13 U/L — ABNORMAL LOW (ref 15–41)
Albumin: 5.1 g/dL — ABNORMAL HIGH (ref 3.5–5.0)
Alkaline Phosphatase: 95 U/L (ref 38–126)
Anion gap: 22 — ABNORMAL HIGH (ref 5–15)
BUN: 12 mg/dL (ref 6–20)
CO2: 22 mmol/L (ref 22–32)
Calcium: 10.6 mg/dL — ABNORMAL HIGH (ref 8.9–10.3)
Chloride: 91 mmol/L — ABNORMAL LOW (ref 98–111)
Creatinine, Ser: 0.71 mg/dL (ref 0.44–1.00)
GFR, Estimated: >60 mL/min (ref >60)
Glucose, Bld: 103 mg/dL — ABNORMAL HIGH (ref 70–99)
Potassium: 2.9 mmol/L — ABNORMAL LOW (ref 3.5–5.1)
Sodium: 136 mmol/L (ref 135–145)
Total Bilirubin: 0.7 mg/dL (ref 0.0–1.2)
Total Protein: 8.6 g/dL — ABNORMAL HIGH (ref 6.5–8.1)

## 2024-05-10 LAB — CBC
HCT: 45 % (ref 36.0–46.0)
Hemoglobin: 15 g/dL (ref 12.0–15.0)
MCH: 27.3 pg (ref 26.0–34.0)
MCHC: 33.3 g/dL (ref 30.0–36.0)
MCV: 81.8 fL (ref 80.0–100.0)
Platelets: 288 K/uL (ref 150–400)
RBC: 5.5 MIL/uL — ABNORMAL HIGH (ref 3.87–5.11)
RDW: 13.6 % (ref 11.5–15.5)
WBC: 17.4 K/uL — ABNORMAL HIGH (ref 4.0–10.5)
nRBC: 0 % (ref 0.0–0.2)

## 2024-05-10 LAB — MAGNESIUM: Magnesium: 2.2 mg/dL (ref 1.7–2.4)

## 2024-05-10 LAB — LIPASE, BLOOD: Lipase: 21 U/L (ref 11–51)

## 2024-05-10 MED ORDER — PANTOPRAZOLE SODIUM 40 MG IV SOLR
40.0000 mg | Freq: Once | INTRAVENOUS | Status: AC
  Administered 2024-05-10: 40 mg via INTRAVENOUS
  Filled 2024-05-10: qty 10

## 2024-05-10 MED ORDER — ALUM & MAG HYDROXIDE-SIMETH 200-200-20 MG/5ML PO SUSP
30.0000 mL | Freq: Four times a day (QID) | ORAL | Status: DC | PRN
  Administered 2024-05-10 – 2024-05-12 (×2): 30 mL via ORAL
  Filled 2024-05-10 (×2): qty 30

## 2024-05-10 MED ORDER — SODIUM CHLORIDE 0.9 % IV BOLUS
1000.0000 mL | Freq: Once | INTRAVENOUS | Status: AC
  Administered 2024-05-10: 1000 mL via INTRAVENOUS

## 2024-05-10 MED ORDER — METHYLPREDNISOLONE SODIUM SUCC 125 MG IJ SOLR
48.0000 mg | Freq: Once | INTRAMUSCULAR | Status: AC
  Administered 2024-05-10: 48 mg via INTRAVENOUS
  Filled 2024-05-10: qty 2

## 2024-05-10 MED ORDER — SCOPOLAMINE 1 MG/3DAYS TD PT72
1.0000 | MEDICATED_PATCH | TRANSDERMAL | Status: DC
  Administered 2024-05-10 – 2024-05-16 (×3): 1.5 mg via TRANSDERMAL
  Filled 2024-05-10 (×3): qty 1

## 2024-05-10 MED ORDER — POTASSIUM CHLORIDE 10 MEQ/100ML IV SOLN
10.0000 meq | INTRAVENOUS | Status: AC
  Administered 2024-05-10 (×3): 10 meq via INTRAVENOUS
  Filled 2024-05-10 (×3): qty 100

## 2024-05-10 MED ORDER — POTASSIUM CHLORIDE 10 MEQ/100ML IV SOLN
10.0000 meq | Freq: Once | INTRAVENOUS | Status: AC
  Administered 2024-05-10: 10 meq via INTRAVENOUS
  Filled 2024-05-10: qty 100

## 2024-05-10 MED ORDER — VITAMIN B-6 25 MG PO TABS
25.0000 mg | ORAL_TABLET | Freq: Two times a day (BID) | ORAL | Status: DC
  Administered 2024-05-10 – 2024-05-16 (×9): 25 mg via ORAL
  Filled 2024-05-10 (×10): qty 1

## 2024-05-10 MED ORDER — DOXYLAMINE SUCCINATE (SLEEP) 25 MG PO TABS
25.0000 mg | ORAL_TABLET | Freq: Two times a day (BID) | ORAL | Status: DC
  Administered 2024-05-10 – 2024-05-16 (×9): 25 mg via ORAL
  Filled 2024-05-10 (×11): qty 1

## 2024-05-10 MED ORDER — METHYLPREDNISOLONE 4 MG PO TABS: 8.0000 mg | ORAL_TABLET | Freq: Every day | ORAL | Status: DC

## 2024-05-10 MED ORDER — METOCLOPRAMIDE HCL 5 MG/ML IJ SOLN: 10.0000 mg | Freq: Once | INTRAMUSCULAR | Status: DC

## 2024-05-10 MED ORDER — METHYLPREDNISOLONE 4 MG PO TABS: 4.0000 mg | ORAL_TABLET | Freq: Every day | ORAL | Status: DC

## 2024-05-10 MED ORDER — TRIMETHOBENZAMIDE HCL 100 MG/ML IM SOLN
200.0000 mg | Freq: Four times a day (QID) | INTRAMUSCULAR | Status: DC | PRN
  Administered 2024-05-10 – 2024-05-13 (×2): 200 mg via INTRAMUSCULAR
  Filled 2024-05-10 (×3): qty 2

## 2024-05-10 MED ORDER — HYDROXYZINE HCL 50 MG PO TABS
50.0000 mg | ORAL_TABLET | Freq: Four times a day (QID) | ORAL | Status: DC | PRN
Start: 2024-05-10 — End: 2024-05-16
  Filled 2024-05-10: qty 1

## 2024-05-10 MED ORDER — DEXTROSE-SODIUM CHLORIDE 5-0.45 % IV SOLN: INTRAVENOUS | Status: AC

## 2024-05-10 MED ORDER — METHYLPREDNISOLONE 16 MG PO TABS
16.0000 mg | ORAL_TABLET | Freq: Every day | ORAL | Status: DC
  Filled 2024-05-10: qty 1

## 2024-05-10 MED ORDER — GLYCOPYRROLATE 0.2 MG/ML IJ SOLN
0.2000 mg | Freq: Four times a day (QID) | INTRAMUSCULAR | Status: DC | PRN
  Administered 2024-05-10 – 2024-05-14 (×10): 0.2 mg via INTRAVENOUS
  Filled 2024-05-10 (×10): qty 1

## 2024-05-10 MED ORDER — METHYLPREDNISOLONE 4 MG PO TABS
4.0000 mg | ORAL_TABLET | Freq: Every day | ORAL | Status: DC
Start: 2024-05-16 — End: 2024-05-20

## 2024-05-10 MED ORDER — METHYLPREDNISOLONE 16 MG PO TABS: 16.0000 mg | ORAL_TABLET | Freq: Every day | ORAL | Status: DC

## 2024-05-10 MED ORDER — METHYLPREDNISOLONE 4 MG PO TABS
8.0000 mg | ORAL_TABLET | Freq: Every day | ORAL | Status: DC
  Administered 2024-05-15 – 2024-05-16 (×2): 8 mg via ORAL
  Filled 2024-05-10 (×2): qty 2

## 2024-05-10 MED ORDER — ACETAMINOPHEN 325 MG PO TABS: 650.0000 mg | ORAL_TABLET | Freq: Four times a day (QID) | ORAL | Status: DC | PRN

## 2024-05-10 MED ORDER — METHYLPREDNISOLONE 16 MG PO TABS
16.0000 mg | ORAL_TABLET | Freq: Every day | ORAL | Status: AC
  Filled 2024-05-10 (×4): qty 1

## 2024-05-10 MED ORDER — LORAZEPAM 2 MG/ML IJ SOLN
0.5000 mg | Freq: Once | INTRAMUSCULAR | Status: AC
  Administered 2024-05-10: 0.5 mg via INTRAVENOUS
  Filled 2024-05-10: qty 1

## 2024-05-10 MED ORDER — POTASSIUM CHLORIDE 10 MEQ/100ML IV SOLN: 10.0000 meq | INTRAVENOUS | Status: DC

## 2024-05-10 MED ORDER — HYDROXYZINE HCL 50 MG/ML IM SOLN
50.0000 mg | Freq: Four times a day (QID) | INTRAMUSCULAR | Status: DC | PRN
  Administered 2024-05-10: 50 mg via INTRAMUSCULAR
  Filled 2024-05-10 (×3): qty 1

## 2024-05-10 NOTE — ED Notes (Signed)
 Called [facility] for transport.

## 2024-05-10 NOTE — ED Provider Notes (Signed)
 Patient care transferred with plan to follow-up blood work, IV fluids and reassess.  Patient [redacted] weeks pregnant currently has OB physician following presents with clinical concern for dehydration and recurrent vomiting.  Patient's had similar in the past requiring admission/transfer to MAU.  Concern is patient QT prolonged.  Difficult IV ultrasound-guided by myself.  SABRAUltrasound ED Peripheral IV (Provider)  Date/Time: 05/10/2024 7:15 AM  Performed by: Tonia Chew, MD Authorized by: Tonia Chew, MD   Procedure details:    Indications: multiple failed IV attempts     Skin Prep: chlorhexidine gluconate     Location:  Right AC   Angiocath:  20 G   Bedside Ultrasound Guided: Yes     Images: archived     Patient tolerated procedure without complications: Yes     Dressing applied: Yes   .Critical Care  Performed by: Tonia Chew, MD Authorized by: Tonia Chew, MD   Critical care provider statement:    Critical care time (minutes):  35   Critical care start time:  05/10/2024 8:50 AM   Critical care end time:  05/10/2024 9:25 AM   Critical care time was exclusive of:  Separately billable procedures and treating other patients and teaching time   Critical care was necessary to treat or prevent imminent or life-threatening deterioration of the following conditions:  Dehydration and metabolic crisis   Critical care was time spent personally by me on the following activities:  Pulse oximetry, re-evaluation of patient's condition, ordering and review of laboratory studies and ordering and performing treatments and interventions  Blood work independently reviewed potassium low 2.9 secondary to vomiting and dehydration, anion gap 22.  IV fluids, IV potassium ordered.  Discussed with OB women's on-call who agreed with transfer/admission for dehydration and further evaluation.  Updated patient on plan of care.   Tonia Chew, MD 05/10/24 [phone]

## 2024-05-10 NOTE — Progress Notes (Signed)
 Pharmacy Consult:   MEDROL  (METHYLPREDNISOLONE ) TAPER  FOR HYPEREMESIS GRAVIDARUM PATIENTS  The following is a 14 day taper of methylprednisolone  for hyperemesis. Doses on day 1  will be given IV.  All doses starting on day 2  will be given PO. (If patient cannot tolerate oral medications, contact the pharmacy to change route to IV.)   Date Day Morning Midday Bedtime  7/12 1 - 48 mg -  7/13 2 16  mg 16 mg 16 mg  7/14 3 16  mg 16 mg 16 mg  7/15 4 16  mg 8 mg 16 mg  7/16 5 16  mg 8 mg 8 mg  7/17 6 8  mg 8 mg 8 mg  7/18 7 8  mg 4 mg 8 mg  7/19 8 8  mg 4 mg 4 mg  7/20 9 8  mg 4 mg   7/21 10 8  mg 4 mg   7/22 11 8  mg    7/23 12 8  mg    7/24 13 4  mg    7/25 14 4  mg     Check fasting blood sugars daily while on the taper. Notify MD if fasting blood sugar>95.  Nichole Patterson 05/10/2024

## 2024-05-10 NOTE — ED Notes (Signed)
 This RN attempted to obtain IV access x2, and Rosina NOVAK, RN tried x3 with no success.

## 2024-05-10 NOTE — Progress Notes (Signed)
 Upon rounding pt found to have taken off tele leads and IV to get in shower. Pt states she has had multiple episodes of vomiting and indigestion and only feels better in the shower. MD made aware.

## 2024-05-10 NOTE — ED Triage Notes (Signed)
 Pt reports concern for vomiting x1 month. Sts she's vomited approx 20 times over the past 24 hours. Endorse adb discomfort only with vomiting. No fevers/chills. No recent travel. No sick exposure.   Per note pt currently [redacted] week pregnant.

## 2024-05-10 NOTE — H&P (Signed)
 FACULTY PRACTICE ANTEPARTUM ADMISSION HISTORY AND PHYSICAL NOTE   History of Present Illness: Nichole Patterson is a 33 y.o. G5P4004 at [redacted]w[redacted]d admitted for HEG with metabolic imbalance and prolonged QT interval.  Presented to [facility] this morning for continued and intractable nause anad vomiting. Had multiple visits in MAU.  She reported taking prescribed medications at home without relief.  Denies any marijuana use in the past 3 months, or other drugs.  Reported abdominal discomfort due to vomiting, no bleeding.   In the ED, she was noted to continue vomiting despite antiemetics, was also noted to have significant hypokalemia and also prolonged QT. Our service was consulted for further management, we recommended transferring her to [facility] for admission and further management.  On arrival here at [facility], she reports one episode of nausea and vomiting.  Also continual spitting was reported.  Has mild abdominal pain from the continued vomiting.  Denies any abnormal vaginal bleeding, discharge,  fevers, chills, sweats, dysuria, other general symptoms.  Patient Active Problem List   Diagnosis Date Noted   Hyperemesis gravidarum 05/10/2024   Prolonged QT interval 05/10/2024   Hypokalemia 05/10/2024   [redacted] weeks gestation of pregnancy 05/10/2024   Ptyalism 05/10/2024    Past Medical History:  Diagnosis Date   Asthma    Chlamydia    Headache    Heart murmur    Trichomonas contact     Past Surgical History:  Procedure Laterality Date   CESAREAN SECTION N/A 11/18/2018   Procedure: CESAREAN SECTION;  Surgeon: Izell Harari, MD;  Location: [facility] BIRTH[facility] SU[facility];  Service: Obstetrics;  Laterality: N/A;   MOUTH SURGERY      OB History  Gravida Para Term Preterm AB Living  5 4 4  0 0 4  SAB IAB Ectopic Multiple Live Births     0 3    # Outcome Date GA Lbr Len/2nd Weight Sex Type Anes PTL Lv  5 Current           4 Term 12/07/22    F CS-LTranv        Complications: Placental abruption  3 Term 11/18/18  [redacted]w[redacted]d  3650 g F CS-LTranv EPI  LIV  2 Term 10/06/12 [redacted]w[redacted]d  3005 g F Vag-Spont  N LIV  1 Term 10/09/11 [redacted]w[redacted]d   M Vag-Spont  N LIV    Social History   Socioeconomic History   Marital status: Single    Spouse name: Not on file   Number of children: 2   Years of education: some college   Highest education level: 12th grade  Occupational History   Occupation: Unemployed  Tobacco Use   Smoking status: Never   Smokeless tobacco: Never  Vaping Use   Vaping status: Never Used  Substance and Sexual Activity   Alcohol use: Never   Drug use: Never   Sexual activity: Yes    Birth control/protection: None  Other Topics Concern   Not on file  Social History Narrative   Not on file   Social Drivers of Health   Financial Resource Strain: Not on file  Food Insecurity: No Food Insecurity (05/01/2024)   Hunger Vital Sign    Worried About Running Out of Food in the Last Year: Never true    Ran Out of Food in the Last Year: Never true  Transportation Needs: No Transportation Needs (05/01/2024)   PRAPARE - Administrator, Civil Service (Medical): No    Lack of Transportation (Non-Medical): No  Physical Activity: Not on  file  Stress: Not on file  Social Connections: Not on file     Family History  Problem Relation Age of Onset   Pneumonia Father     Allergies  Allergen Reactions   Food Hives    Salmon   Latex Itching    Redness    Medications Prior to Admission  Medication Sig Dispense Refill Last Dose/Taking   famotidine  (PEPCID ) 40 MG tablet Take 1 tablet (40 mg total) by mouth daily. 90 tablet 3    metoCLOPramide  (REGLAN ) 10 MG tablet Take 1 tablet (10 mg total) by mouth every 8 (eight) hours as needed for nausea. 30 tablet 2    ondansetron  (ZOFRAN -ODT) 8 MG disintegrating tablet Take 1 tablet (8 mg total) by mouth every 8 (eight) hours as needed for nausea or vomiting. 30 tablet 2    Prenatal Vit-Fe Phos-FA-Omega (VITAFOL  GUMMIES) 3.33-0.333-34.8 MG CHEW Chew 1  tablet by mouth daily. 90 tablet 5    promethazine  (PHENERGAN ) 12.5 MG tablet Take 1 tablet (12.5 mg total) by mouth every 6 (six) hours as needed for nausea or vomiting. 30 tablet 2    scopolamine  (TRANSDERM-SCOP) 1 MG/3DAYS Place 1 patch (1.5 mg total) onto the skin every 3 (three) days. 10 patch 12     Review of Systems - Negative except what is mentioned in HPI  Vitals:  BP 127/84 (BP Location: Right Arm)   Pulse 85   Temp 97.7 F (36.5 C) (Oral)   Resp 18   LMP 03/24/2024 (Exact Date)   SpO2 100%  Physical Examination: CONSTITUTIONAL: Well-developed, well-nourished female in no acute distress.  NECK: Normal range of motion, supple, no masses SKIN: Skin is warm and dry. No rash noted. Not diaphoretic. No erythema. No pallor. NEUROLOGIC: Alert and oriented to person, place, and time. Normal reflexes, muscle tone coordination. No cranial nerve deficit noted. PSYCHIATRIC: Normal mood and affect. Normal behavior. Normal judgment and thought content. CARDIOVASCULAR: Normal heart rate noted, regular rhythm RESPIRATORY: Effort and breath sounds normal, no problems with respiration noted ABDOMEN: Soft, nontender, nondistended. PELVIC: Deferred MUSCULOSKELETAL: Normal range of motion. No edema and no tenderness. 2+ distal pulses.  Labs:  Results for orders placed or performed during the hospital encounter of 05/10/24 (from the past 24 hours)  Lipase, blood   Collection Time: 05/10/24  7:51 AM  Result Value Ref Range   Lipase 21 11 - 51 U/L  Comprehensive metabolic panel   Collection Time: 05/10/24  7:51 AM  Result Value Ref Range   Sodium 136 135 - 145 mmol/L   Potassium 2.9 (L) 3.5 - 5.1 mmol/L   Chloride 91 (L) 98 - 111 mmol/L   CO2 22 22 - 32 mmol/L   Glucose, Bld 103 (H) 70 - 99 mg/dL   BUN 12 6 - 20 mg/dL   Creatinine, Ser 9.28 0.44 - 1.00 mg/dL   Calcium 89.3 (H) 8.9 - 10.3 mg/dL   Total Protein 8.6 (H) 6.5 - 8.1 g/dL   Albumin 5.1 (H) 3.5 - 5.0 g/dL   AST 13 (L) 15 - 41  U/L   ALT 14 0 - 44 U/L   Alkaline Phosphatase 95 38 - 126 U/L   Total Bilirubin 0.7 0.0 - 1.2 mg/dL   GFR, Estimated >39 >39 mL/min   Anion gap 22 (H) 5 - 15  CBC   Collection Time: 05/10/24  7:51 AM  Result Value Ref Range   WBC 17.4 (H) 4.0 - 10.5 K/uL   RBC 5.50 (H) 3.87 -  5.11 MIL/uL   Hemoglobin 15.0 12.0 - 15.0 g/dL   HCT 54.9 63.9 - 53.9 %   MCV 81.8 80.0 - 100.0 fL   MCH 27.3 26.0 - 34.0 pg   MCHC 33.3 30.0 - 36.0 g/dL   RDW 86.3 88.4 - 84.4 %   Platelets 288 150 - 400 K/uL   nRBC 0.0 0.0 - 0.2 %  Magnesium    Collection Time: 05/10/24  7:51 AM  Result Value Ref Range   Magnesium  2.2 1.7 - 2.4 mg/dL    Imaging Studies: US  OB Comp < 14 Wks Result Date: 05/01/2024 CLINICAL DATA:  abd pain early preg. EXAM: OBSTETRIC <14 WK ULTRASOUND TECHNIQUE: Transabdominal ultrasound was performed for evaluation of the gestation as well as the maternal uterus and adnexal regions. COMPARISON:  None Available. FINDINGS: Intrauterine gestational sac: Single Yolk sac:  Visualized. Embryo:  Visualized. Cardiac Activity: Visualized. Heart Rate: 150 bpm CRL:   8.7 mm   6 w 6 d                  US  EDC: 12/19/2024. Subchorionic hemorrhage: There is a small anechoic subchorionic hemorrhage occupying less than 25% of the circumference of the gestational sac. Maternal uterus/adnexae: A simple cyst noted in the right ovary measuring up to 2.7 x 2.9 x 3.0 cm. Bilateral ovaries are otherwise within normal limits. IMPRESSION: *Single live intrauterine gestation with crown-rump length corresponding to 6 weeks 6 days. Electronically Signed   By: Ree Molt M.D.   On: 05/01/2024 11:47    Assessment and Plan: Principal Problem:   Hyperemesis gravidarum Active Problems:   Prolonged QT interval   Hypokalemia   [redacted] weeks gestation of pregnancy   Ptyalism  Admit to [facility] Telemetry ordered, will monitor QT interval length Continue IV fluid repletion, ordered for potassium repletion Had a long conversation  with pharmacist Alica Pear, [facility]) about appropriate medications to order for her that will not exacerbate her QT interval; she was ordered for these medications accordingly, see below. Greatly appreciate the pharmacy input! Medications   hydrOXYzine  (VISTARIL ) injection 50 mg  IV q6h prn    scopolamine  (TRANSDERM-SCOP) 1 MG/3DAYS 1.5 mg q72h   trimethobenzamide  (TIGAN ) injection 200 mg IM q6h prn   methylPREDNISolone  taper   glycopyrrolate  (ROBINUL ) injection 0.2 mg IV tid prn  Will advance diet as tolerated, currently NPO except for sips as tolerated Routine antenatal care   GLORIS HUGGER, MD, FACOG Attending Obstetrician & Gynecologist Faculty Practice, [facility]

## 2024-05-10 NOTE — ED Provider Notes (Signed)
 Emergency Department Provider Note   I have reviewed the triage vital signs and the nursing notes.   HISTORY  Chief Complaint Emesis and Abdominal Pain   HPI Nichole Patterson is a 33 y.o. female past surgery of hyperemesis, currently [redacted] wks pregnant, presents to the emergency department for evaluation of intractable nausea and vomiting at home.  She has had issues with this in the past during prior pregnancies.  She states that she has been trying to take her medications for nausea including Reglan  at home without relief.  She states soon after taking that medicine this morning she had vomiting again.  She has used marijuana in the past but denies any use in the past 3 months.  Denies other drug use.  She states she has got diffuse cramping abdominal discomfort.  No vaginal bleeding.  No UTI symptoms.  No fevers.   Past Medical History:  Diagnosis Date   Asthma    Chlamydia    Headache    Heart murmur    Trichomonas contact     Review of Systems  Constitutional: No fever/chills Cardiovascular: Denies chest pain. Respiratory: Denies shortness of breath. Gastrointestinal: Positive abdominal pain. Positive nausea and vomiting.  No diarrhea.  No constipation. Skin: Negative for rash. Neurological: Negative for headaches.  ____________________________________________   PHYSICAL EXAM:  VITAL SIGNS: ED Triage Vitals [05/10/24 0617]  Encounter Vitals Group     BP (!) 145/114     Pulse Rate (!) 118     Resp 15     Temp 98.3 F (36.8 C)     Temp Source Oral     SpO2 99 %   Constitutional: Alert and oriented. Well appearing and in no acute distress. Eyes: Conjunctivae are normal.  Head: Atraumatic. Nose: No congestion/rhinnorhea. Mouth/Throat: Mucous membranes are dry.  Neck: No stridor.   Cardiovascular: Normal rate, regular rhythm. Good peripheral circulation. Grossly normal heart sounds.   Respiratory: Normal respiratory effort.  No retractions. Lungs  CTAB. Gastrointestinal: Soft and nontender. No distention.  Musculoskeletal: No gross deformities of extremities. Neurologic:  Normal speech and language.  Skin:  Skin is warm, dry and intact. No rash noted.   ____________________________________________   LABS (all labs ordered are listed, but only abnormal results are displayed)  Labs Reviewed  LIPASE, BLOOD  COMPREHENSIVE METABOLIC PANEL WITH GFR  CBC  URINALYSIS, ROUTINE W REFLEX MICROSCOPIC  MAGNESIUM   URINE DRUG SCREEN   ____________________________________________   PROCEDURES  Procedure(s) performed:   Procedures  None  ____________________________________________   INITIAL IMPRESSION / ASSESSMENT AND PLAN / ED COURSE  Pertinent labs & imaging results that were available during my care of the patient were reviewed by me and considered in my medical decision making (see chart for details).   This patient is Presenting for Evaluation of vomting, which does require a range of treatment options, and is a complaint that involves a high risk of morbidity and mortality.  The Differential Diagnoses includes but is not exclusive to acute cholecystitis, intrathoracic causes for epigastric abdominal pain, gastritis, duodenitis, pancreatitis, small bowel or large bowel obstruction, abdominal aortic aneurysm, hernia, gastritis, etc.   Critical Interventions-    Medications  sodium chloride  0.9 % bolus 1,000 mL (has no administration in time range)  metoCLOPramide  (REGLAN ) injection 10 mg (has no administration in time range)    I decided to review pertinent External Data, and in summary patient with ED visit on 7/3 with similar symptoms.  Pelvic ultrasound at that time showed IUP  measuring 6wks 6 days.    Clinical Laboratory Tests Ordered, included CBC/CMP/Lipase/Mg which are pending.   Cardiac Monitor Tracing which shows tachycardia.    Social Determinants of Health Risk positive THC use but last use reports 3  months prior.   Medical Decision Making: Summary:  The patient presents to the emergency department for evaluation of nausea and vomiting.  Some cramping abdominal pain.  She has had this recurrently.  Currently approximately [redacted] weeks pregnant.  Plan for IV fluids, Reglan , screening blood work and reassess.  Do not plan for abdominal imaging at this time.   Reevaluation with update and discussion with patient. Labs pending. Care transferred to oncoming team.   Patient's presentation is most consistent with acute presentation with potential threat to life or bodily function.   Disposition: pending   ____________________________________________  FINAL CLINICAL IMPRESSION(S) / ED DIAGNOSES  Final diagnoses:  Nausea and vomiting, unspecified vomiting type    Note:  This document was prepared using Dragon voice recognition software and may include unintentional dictation errors.  Fonda Law, MD, [facility]    Annina Piotrowski, Fonda MATSU, MD 05/10/24 [phone]

## 2024-05-11 LAB — BASIC METABOLIC PANEL WITH GFR
Anion gap: 10 (ref 5–15)
BUN: 6 mg/dL (ref 6–20)
CO2: 20 mmol/L — ABNORMAL LOW (ref 22–32)
Calcium: 9 mg/dL (ref 8.9–10.3)
Chloride: 102 mmol/L (ref 98–111)
Creatinine, Ser: 0.72 mg/dL (ref 0.44–1.00)
GFR, Estimated: >60 mL/min (ref >60)
Glucose, Bld: 115 mg/dL — ABNORMAL HIGH (ref 70–99)
Potassium: 2.8 mmol/L — ABNORMAL LOW (ref 3.5–5.1)
Sodium: 132 mmol/L — ABNORMAL LOW (ref 135–145)

## 2024-05-11 LAB — URINALYSIS, ROUTINE W REFLEX MICROSCOPIC
Bilirubin Urine: NEGATIVE
Bilirubin Urine: NEGATIVE
Glucose, UA: NEGATIVE mg/dL
Glucose, UA: NEGATIVE mg/dL
Hgb urine dipstick: NEGATIVE
Hgb urine dipstick: NEGATIVE
Ketones, ur: 20 mg/dL — AB
Ketones, ur: 20 mg/dL — AB
Leukocytes,Ua: NEGATIVE
Leukocytes,Ua: NEGATIVE
Nitrite: NEGATIVE
Nitrite: NEGATIVE
Protein, ur: NEGATIVE mg/dL
Protein, ur: NEGATIVE mg/dL
Specific Gravity, Urine: 1.018 (ref 1.005–1.030)
Specific Gravity, Urine: 1.019 (ref 1.005–1.030)
pH: 6 (ref 5.0–8.0)
pH: 6 (ref 5.0–8.0)

## 2024-05-11 LAB — TSH: TSH: 0.052 u[IU]/mL — ABNORMAL LOW (ref 0.350–4.500)

## 2024-05-11 LAB — RAPID URINE DRUG SCREEN, HOSP PERFORMED
Amphetamines: NOT DETECTED
Barbiturates: NOT DETECTED
Benzodiazepines: NOT DETECTED
Cocaine: NOT DETECTED
Opiates: NOT DETECTED
Tetrahydrocannabinol: POSITIVE — AB

## 2024-05-11 LAB — T4, FREE: Free T4: 1.8 ng/dL — ABNORMAL HIGH (ref 0.61–1.12)

## 2024-05-11 LAB — MAGNESIUM: Magnesium: 1.9 mg/dL (ref 1.7–2.4)

## 2024-05-11 MED ORDER — DEXTROSE-SODIUM CHLORIDE 5-0.45 % IV SOLN: INTRAVENOUS | Status: DC

## 2024-05-11 MED ORDER — METHYLPREDNISOLONE 4 MG PO TABS
4.0000 mg | ORAL_TABLET | Freq: Every day | ORAL | Status: DC
  Administered 2024-05-16: 4 mg via ORAL
  Filled 2024-05-11: qty 1

## 2024-05-11 MED ORDER — METHYLPREDNISOLONE 4 MG PO TABS
8.0000 mg | ORAL_TABLET | Freq: Every day | ORAL | Status: DC
  Administered 2024-05-15: 8 mg via ORAL
  Filled 2024-05-11 (×2): qty 2

## 2024-05-11 MED ORDER — METHYLPREDNISOLONE SODIUM SUCC 40 MG IJ SOLR
16.0000 mg | Freq: Three times a day (TID) | INTRAMUSCULAR | Status: AC
  Administered 2024-05-11 – 2024-05-12 (×3): 16 mg via INTRAVENOUS
  Filled 2024-05-11 (×3): qty 0.4

## 2024-05-11 MED ORDER — METHYLPREDNISOLONE 16 MG PO TABS: 16.0000 mg | ORAL_TABLET | Freq: Every day | ORAL | Status: AC

## 2024-05-11 MED ORDER — METHYLPREDNISOLONE 16 MG PO TABS
16.0000 mg | ORAL_TABLET | Freq: Every day | ORAL | Status: AC
  Filled 2024-05-11: qty 1

## 2024-05-11 MED ORDER — POTASSIUM CHLORIDE 10 MEQ/100ML IV SOLN: 10.0000 meq | INTRAVENOUS | Status: DC

## 2024-05-11 MED ORDER — METHYLPREDNISOLONE 4 MG PO TABS: 4.0000 mg | ORAL_TABLET | Freq: Every day | ORAL | Status: DC

## 2024-05-11 MED ORDER — METHYLPREDNISOLONE 4 MG PO TABS
8.0000 mg | ORAL_TABLET | Freq: Every day | ORAL | Status: AC
  Administered 2024-05-15: 8 mg via ORAL
  Filled 2024-05-11 (×3): qty 2

## 2024-05-11 MED ORDER — CAPSAICIN 0.075 % EX CREA
TOPICAL_CREAM | Freq: Two times a day (BID) | CUTANEOUS | Status: DC | PRN
  Filled 2024-05-11: qty 57

## 2024-05-11 MED ORDER — POTASSIUM CHLORIDE 10 MEQ/100ML IV SOLN
10.0000 meq | INTRAVENOUS | Status: AC
  Administered 2024-05-11 (×5): 10 meq via INTRAVENOUS
  Filled 2024-05-11 (×5): qty 100

## 2024-05-11 MED ORDER — POTASSIUM CHLORIDE 10 MEQ/100ML IV SOLN
INTRAVENOUS | Status: AC
  Administered 2024-05-11: 10 meq via INTRAVENOUS
  Filled 2024-05-11: qty 100

## 2024-05-11 NOTE — Progress Notes (Signed)
 While rounding RN found Pt. in shower. Pt. Removed tele leads and IV on her own. RN educated Pt. On importance of telemetry monitoring. MD notified.

## 2024-05-11 NOTE — Progress Notes (Signed)
 FACULTY PRACTICE ANTEPARTUM COMPREHENSIVE PROGRESS NOTE  Nichole Patterson is a 33 y.o. G5P4004 at [redacted]w[redacted]d who is admitted for HEG, electrolyte imbalance, prolonged QT interval.   Estimated Date of Delivery: 12/19/24  Length of Stay:  1 Days. Admitted 05/10/2024  Subjective: Patient is refusing to get labs drawn this morning, also keeps removing her telemetry leads.  She reports having emesis overnight, no episodes this morning.  Vitals:  Blood pressure 132/78, pulse 93, temperature 98.3 F (36.8 C), temperature source Oral, resp. rate 16, weight 74.2 kg, last menstrual period 03/24/2024, SpO2 100%, unknown if currently breastfeeding. Physical Examination: CONSTITUTIONAL: Well-developed, well-nourished female in no acute distress.  NEUROLOGIC: Alert and oriented to person, place, and time. No cranial nerve deficit noted. PSYCHIATRIC: Normal mood and affect. Normal behavior. Normal judgment and thought content. CARDIOVASCULAR: Normal heart rate noted, regular rhythm RESPIRATORY: Effort and breath sounds normal, no problems with respiration noted MUSCULOSKELETAL: Normal range of motion. No edema and no tenderness. 2+ distal pulses. ABDOMEN: Soft, nontender, nondistended, gravid. CERVIX:  Deferred   Results for orders placed or performed during the hospital encounter of 05/10/24 (from the past 48 hours)  Lipase, blood     Status: None   Collection Time: 05/10/24  7:51 AM  Result Value Ref Range   Lipase 21 11 - 51 U/L    Comment: Performed at [facility], [address] [place], [place] [address]  Comprehensive metabolic panel     Status: Abnormal   Collection Time: 05/10/24  7:51 AM  Result Value Ref Range   Sodium 136 135 - 145 mmol/L    Comment: Electrolytes repeated to confirm.   Potassium 2.9 (L) 3.5 - 5.1 mmol/L   Chloride 91 (L) 98 - 111 mmol/L   CO2 22 22 - 32 mmol/L   Glucose, Bld 103 (H) 70 - 99 mg/dL    Comment: Glucose reference range applies only to samples taken  after fasting for at least 8 hours.   BUN 12 6 - 20 mg/dL   Creatinine, Ser 9.28 0.44 - 1.00 mg/dL   Calcium 89.3 (H) 8.9 - 10.3 mg/dL   Total Protein 8.6 (H) 6.5 - 8.1 g/dL   Albumin 5.1 (H) 3.5 - 5.0 g/dL   AST 13 (L) 15 - 41 U/L   ALT 14 0 - 44 U/L   Alkaline Phosphatase 95 38 - 126 U/L   Total Bilirubin 0.7 0.0 - 1.2 mg/dL   GFR, Estimated >39 >39 mL/min    Comment: (NOTE) Calculated using the CKD-EPI Creatinine Equation (2021)    Anion gap 22 (H) 5 - 15    Comment: Performed at [facility], [address] [place], [place] [address]  CBC     Status: Abnormal   Collection Time: 05/10/24  7:51 AM  Result Value Ref Range   WBC 17.4 (H) 4.0 - 10.5 K/uL   RBC 5.50 (H) 3.87 - 5.11 MIL/uL   Hemoglobin 15.0 12.0 - 15.0 g/dL   HCT 54.9 63.9 - 53.9 %   MCV 81.8 80.0 - 100.0 fL   MCH 27.3 26.0 - 34.0 pg   MCHC 33.3 30.0 - 36.0 g/dL   RDW 86.3 88.4 - 84.4 %   Platelets 288 150 - 400 K/uL   nRBC 0.0 0.0 - 0.2 %    Comment: Performed at [facility], [address] [place], [place] [address]  Magnesium      Status: None   Collection Time: 05/10/24  7:51 AM  Result Value Ref Range   Magnesium  2.2 1.7 - 2.4 mg/dL    Comment: Performed at [facility], [address] [place], [place] [address]   QT Interval Review On review of telemetry strip from 05/10/24 1900: QT 0.40, QTc 0.44 (was 0.51 and 0.61 earlier around 1100)  Current scheduled medications  pyridOXINE   25 mg Oral BID   And   doxylamine  (Sleep)  25 mg Oral BID   methylPREDNISolone   16 mg Oral Q breakfast   Followed by   NOREEN ON 05/15/2024] methylPREDNISolone   8 mg Oral Q breakfast   Followed by   NOREEN ON 05/22/2024] methylPREDNISolone   4 mg Oral Q breakfast   methylPREDNISolone   16 mg Oral Q1400   Followed by   NOREEN ON 05/13/2024] methylPREDNISolone   8 mg Oral Q1400   Followed by   NOREEN ON 05/16/2024] methylPREDNISolone   4 mg Oral Q1400   methylPREDNISolone   16 mg Oral QHS    Followed by   NOREEN ON 05/14/2024] methylPREDNISolone   8 mg Oral QHS   Followed by   NOREEN ON 05/17/2024] methylPREDNISolone   4 mg Oral QHS   scopolamine   1 patch Transdermal Q72H    I have reviewed the patient's current medications.  ASSESSMENT: Principal Problem:   Hyperemesis gravidarum Active Problems:   Prolonged QT interval   Hypokalemia   [redacted] weeks gestation of pregnancy   Ptyalism   PLAN: Emphasized importance of being able to check labs and EKG to determine her electrolyte status, QT interval. Continue antiemetics as needed, recheck QT interval this morning and replete electrolytes as needed. Continue to avoid agents that can prolong QT for now. Advance diet as tolerated. Continue routine antenatal care.   GLORIS HUGGER, MD, FACOG Obstetrician & Gynecologist, [facility] for [employer], [facility]

## 2024-05-12 ENCOUNTER — Inpatient Hospital Stay (HOSPITAL_COMMUNITY)

## 2024-05-12 DIAGNOSIS — O468X9 Other antepartum hemorrhage, unspecified trimester: Secondary | ICD-10-CM | POA: Insufficient documentation

## 2024-05-12 LAB — BASIC METABOLIC PANEL WITH GFR
Anion gap: 8 (ref 5–15)
BUN: <5 mg/dL — ABNORMAL LOW (ref 6–20)
CO2: 22 mmol/L (ref 22–32)
Calcium: 9.3 mg/dL (ref 8.9–10.3)
Chloride: 104 mmol/L (ref 98–111)
Creatinine, Ser: 0.7 mg/dL (ref 0.44–1.00)
GFR, Estimated: >60 mL/min (ref >60)
Glucose, Bld: 111 mg/dL — ABNORMAL HIGH (ref 70–99)
Potassium: 3.6 mmol/L (ref 3.5–5.1)
Sodium: 134 mmol/L — ABNORMAL LOW (ref 135–145)

## 2024-05-12 LAB — T3, FREE: T3, Free: 2.8 pg/mL (ref 2.0–4.4)

## 2024-05-12 MED ORDER — METHYLPREDNISOLONE SODIUM SUCC 40 MG IJ SOLR
16.0000 mg | Freq: Three times a day (TID) | INTRAMUSCULAR | Status: AC
  Administered 2024-05-12 (×3): 16 mg via INTRAVENOUS
  Filled 2024-05-12 (×3): qty 0.4

## 2024-05-12 MED ORDER — DEXTROSE-SODIUM CHLORIDE 5-0.9 % IV SOLN: INTRAVENOUS | Status: AC

## 2024-05-12 NOTE — Progress Notes (Signed)
 PM Update  OB Ultrasound demonstrates single viable IUP with subchorionic hemorrhage. Per review of 7/3 ultrasound, it appears she had one at that time as well. Continue to monitor bleeding.  EKG within normal limits. No prolonged QT noted. Per telemetry monitoring, last had prolonged QT this morning, current QT within normal limits at 0.40. Patient declining IV fluids but wanting to advance diet with trial of jello. Continue present management  Rollo ONEIDA Bring, MD, FACOG Obstetrician & Gynecologist, [facility] for [facility], [facility]

## 2024-05-12 NOTE — Progress Notes (Signed)
 Notified by RN that patient has started having vaginal bleeding. Patient seen and evaluated. Moderate blood with dime size clot on towel.  SSE: Moderate thin watery blood in vaginal vault, cervix appears closed  RN chaperone present for exam  A/P: Threatened miscarriage -- will get ultrasound to evaluate pregnancy -- further management pending ultrasound evaluation  Rollo ONEIDA Bring, MD, FACOG Obstetrician & Gynecologist, [facility] for [facility], [facility]

## 2024-05-12 NOTE — Progress Notes (Signed)
 FACULTY PRACTICE ANTEPARTUM COMPREHENSIVE PROGRESS NOTE  Nichole Patterson is a 33 y.o. [id number] at [redacted]w[redacted]d who is admitted for hyperemesis.  Estimated Date of Delivery: 12/19/24    Length of Stay:  2 Days. Admitted 05/10/2024  Subjective: Reports feeling nauseated this morning and vomited already x 1. Last vomiting was yesterday evening.    Vitals:  Blood pressure 130/81, pulse 90, temperature 97.7 F (36.5 C), temperature source Axillary, resp. rate 18, weight 75.6 kg, last menstrual period 03/24/2024, SpO2 99%, unknown if currently breastfeeding. Physical Examination: CONSTITUTIONAL: Well-developed, well-nourished female in no acute distress.  NEUROLOGIC: Alert and oriented to person, place, and time. No cranial nerve deficit noted. PSYCHIATRIC: Normal mood and affect. Normal behavior. Normal judgment and thought content. CARDIOVASCULAR: Normal heart rate noted, regular rhythm RESPIRATORY: nonlabored breathing MUSCULOSKELETAL: Normal range of motion. No edema and no tenderness.  ABDOMEN: Soft, nontender, nondistended   Results for orders placed or performed during the hospital encounter of 05/10/24 (from the past 48 hours)  Rapid urine drug screen (hospital performed)     Status: Abnormal   Collection Time: 05/11/24  6:20 AM  Result Value Ref Range   Opiates NONE DETECTED NONE DETECTED   Cocaine NONE DETECTED NONE DETECTED   Benzodiazepines NONE DETECTED NONE DETECTED   Amphetamines NONE DETECTED NONE DETECTED   Tetrahydrocannabinol POSITIVE (A) NONE DETECTED   Barbiturates NONE DETECTED NONE DETECTED    Comment: (NOTE) DRUG SCREEN FOR MEDICAL PURPOSES ONLY.  IF CONFIRMATION IS NEEDED FOR ANY PURPOSE, NOTIFY LAB WITHIN 5 DAYS.  LOWEST DETECTABLE LIMITS FOR URINE DRUG SCREEN Drug Class                     Cutoff (ng/mL) Amphetamine and metabolites    1000 Barbiturate and metabolites    200 Benzodiazepine                 200 Opiates and metabolites        300 Cocaine and metabolites         300 THC                            50 Performed at [facility], [address] [place], [place] [address]   Urinalysis, Routine w reflex microscopic -Urine, Clean Catch     Status: Abnormal   Collection Time: 05/11/24  6:20 AM  Result Value Ref Range   Color, Urine YELLOW YELLOW   APPearance CLEAR CLEAR   Specific Gravity, Urine 1.019 1.005 - 1.030   pH 6.0 5.0 - 8.0   Glucose, UA NEGATIVE NEGATIVE mg/dL   Hgb urine dipstick NEGATIVE NEGATIVE   Bilirubin Urine NEGATIVE NEGATIVE   Ketones, ur 20 (A) NEGATIVE mg/dL   Protein, ur NEGATIVE NEGATIVE mg/dL   Nitrite NEGATIVE NEGATIVE   Leukocytes,Ua NEGATIVE NEGATIVE    Comment: Performed at [facility], [address] [place], [place] [address]  Urinalysis, Routine w reflex microscopic -     Status: Abnormal   Collection Time: 05/11/24  6:20 AM  Result Value Ref Range   Color, Urine YELLOW YELLOW   APPearance CLEAR CLEAR   Specific Gravity, Urine 1.018 1.005 - 1.030   pH 6.0 5.0 - 8.0   Glucose, UA NEGATIVE NEGATIVE mg/dL   Hgb urine dipstick NEGATIVE NEGATIVE   Bilirubin Urine NEGATIVE NEGATIVE   Ketones, ur 20 (A) NEGATIVE mg/dL   Protein, ur NEGATIVE NEGATIVE mg/dL   Nitrite  NEGATIVE NEGATIVE   Leukocytes,Ua NEGATIVE NEGATIVE    Comment: Performed at [facility], [address] [place], [place] [address]  Basic metabolic panel     Status: Abnormal   Collection Time: 05/11/24  8:59 AM  Result Value Ref Range   Sodium 132 (L) 135 - 145 mmol/L   Potassium 2.8 (L) 3.5 - 5.1 mmol/L   Chloride 102 98 - 111 mmol/L   CO2 20 (L) 22 - 32 mmol/L   Glucose, Bld 115 (H) 70 - 99 mg/dL    Comment: Glucose reference range applies only to samples taken after fasting for at least 8 hours.   BUN 6 6 - 20 mg/dL   Creatinine, Ser 9.27 0.44 - 1.00 mg/dL   Calcium 9.0 8.9 - 89.6 mg/dL   GFR, Estimated >39 >39 mL/min    Comment: (NOTE) Calculated using the CKD-EPI Creatinine Equation (2021)    Anion gap 10 5 -  15    Comment: Performed at [facility], [address] [place], [place] [address]  T4, free     Status: Abnormal   Collection Time: 05/11/24  8:59 AM  Result Value Ref Range   Free T4 1.80 (H) 0.61 - 1.12 ng/dL    Comment: (NOTE) Biotin ingestion may interfere with free T4 tests. If the results are inconsistent with the TSH level, previous test results, or the clinical presentation, then consider biotin interference. If needed, order repeat testing after stopping biotin. Performed at [facility], [address], [place], [place] [address]   TSH     Status: Abnormal   Collection Time: 05/11/24  8:59 AM  Result Value Ref Range   TSH 0.052 (L) 0.350 - 4.500 uIU/mL    Comment: Performed by a 3rd Generation assay with a functional sensitivity of <=0.01 uIU/mL. Performed at [facility], [address] [place], [place] [address]   T3, free     Status: None   Collection Time: 05/11/24  8:59 AM  Result Value Ref Range   T3, Free 2.8 2.0 - 4.4 pg/mL    Comment: (NOTE) Performed At: [facility] [address] [place], [place] [id number] Jennette Shorter MD Ey:[id number]   Magnesium      Status: None   Collection Time: 05/11/24 11:30 AM  Result Value Ref Range   Magnesium  1.9 1.7 - 2.4 mg/dL    Comment: Performed at [facility], [address] [place], [place] [address]  Basic metabolic panel     Status: Abnormal   Collection Time: 05/12/24  4:13 AM  Result Value Ref Range   Sodium 134 (L) 135 - 145 mmol/L   Potassium 3.6 3.5 - 5.1 mmol/L   Chloride 104 98 - 111 mmol/L   CO2 22 22 - 32 mmol/L   Glucose, Bld 111 (H) 70 - 99 mg/dL    Comment: Glucose reference range applies only to samples taken after fasting for at least 8 hours.   BUN <5 (L) 6 - 20 mg/dL   Creatinine, Ser 9.29 0.44 - 1.00 mg/dL   Calcium 9.3 8.9 - 89.6 mg/dL   GFR, Estimated >39 >39 mL/min    Comment: (NOTE) Calculated using the CKD-EPI Creatinine Equation (2021)    Anion  gap 8 5 - 15    Comment: Performed at [facility], [address], [place], [place] [address]    No results found.  Current scheduled medications  pyridOXINE   25 mg Oral  BID   And   doxylamine  (Sleep)  25 mg Oral BID   methylPREDNISolone   16 mg Oral Q breakfast   Followed by   NOREEN ON 05/15/2024] methylPREDNISolone   8 mg Oral Q breakfast   Followed by   NOREEN ON 05/22/2024] methylPREDNISolone   4 mg Oral Q breakfast   methylPREDNISolone   16 mg Oral Q1400   Followed by   NOREEN ON 05/13/2024] methylPREDNISolone   8 mg Oral Q1400   Followed by   NOREEN ON 05/16/2024] methylPREDNISolone   4 mg Oral Q1400   methylPREDNISolone   16 mg Oral QHS   Followed by   NOREEN ON 05/14/2024] methylPREDNISolone   8 mg Oral QHS   Followed by   NOREEN ON 05/17/2024] methylPREDNISolone   4 mg Oral QHS   scopolamine   1 patch Transdermal Q72H    I have reviewed the patient's current medications.  ASSESSMENT/PLAN: Principal Problem:   Hyperemesis gravidarum Active Problems:   Prolonged QT interval   Hypokalemia   [redacted] weeks gestation of pregnancy   Ptyalism  Hyperemesis: continues to suffer from nausea with some vomiting, continue current regimen -- B6 25 mg BID, doxylamine  25 mg BID -- methylprednisolone  taper -- scopolamine  patch -- hydroxyzine  50 mg q 6 hr prn -- trimethobenzamide  200 mg every 6 hr prn -- glycopyrrolate  0.2 mg q 6 hr prn   2. Prolonged QT: continue to avoid antiemetics that prolong the interval, daily EKG, replete electrolytes prn  3. Hypokalemia: improved, K 3.6 this morning  4. FEN/GI: continue IVF D5 NS, continue supportive care, advance diet as tolerated  Continue routine antenatal care.   Rollo ONEIDA Bring, MD, FACOG Obstetrician & Gynecologist, [facility] for [facility], [facility]

## 2024-05-13 DIAGNOSIS — Z3A08 8 weeks gestation of pregnancy: Secondary | ICD-10-CM

## 2024-05-13 DIAGNOSIS — O210 Mild hyperemesis gravidarum: Secondary | ICD-10-CM

## 2024-05-13 LAB — BASIC METABOLIC PANEL WITH GFR
Anion gap: 13 (ref 5–15)
BUN: 8 mg/dL (ref 6–20)
CO2: 20 mmol/L — ABNORMAL LOW (ref 22–32)
Calcium: 9 mg/dL (ref 8.9–10.3)
Chloride: 99 mmol/L (ref 98–111)
Creatinine, Ser: 0.69 mg/dL (ref 0.44–1.00)
GFR, Estimated: >60 mL/min (ref >60)
Glucose, Bld: 118 mg/dL — ABNORMAL HIGH (ref 70–99)
Potassium: 3.1 mmol/L — ABNORMAL LOW (ref 3.5–5.1)
Sodium: 132 mmol/L — ABNORMAL LOW (ref 135–145)

## 2024-05-13 LAB — MAGNESIUM: Magnesium: 2 mg/dL (ref 1.7–2.4)

## 2024-05-13 MED ORDER — PANTOPRAZOLE SODIUM 40 MG IV SOLR
40.0000 mg | Freq: Every day | INTRAVENOUS | Status: DC
  Administered 2024-05-13: 40 mg via INTRAVENOUS
  Filled 2024-05-13: qty 10

## 2024-05-13 MED ORDER — METHYLPREDNISOLONE SODIUM SUCC 40 MG IJ SOLR
16.0000 mg | Freq: Two times a day (BID) | INTRAMUSCULAR | Status: AC
  Administered 2024-05-13 (×2): 16 mg via INTRAVENOUS
  Filled 2024-05-13 (×2): qty 0.4

## 2024-05-13 MED ORDER — POTASSIUM CHLORIDE CRYS ER 20 MEQ PO TBCR
20.0000 meq | EXTENDED_RELEASE_TABLET | Freq: Two times a day (BID) | ORAL | Status: DC
  Administered 2024-05-13: 20 meq via ORAL
  Filled 2024-05-13 (×2): qty 1

## 2024-05-13 MED ORDER — POTASSIUM CHLORIDE 10 MEQ/100ML IV SOLN
10.0000 meq | INTRAVENOUS | Status: AC
  Administered 2024-05-13 (×4): 10 meq via INTRAVENOUS
  Filled 2024-05-13 (×3): qty 100

## 2024-05-13 MED ORDER — PANTOPRAZOLE SODIUM 40 MG IV SOLR
40.0000 mg | Freq: Two times a day (BID) | INTRAVENOUS | Status: DC
  Administered 2024-05-13 – 2024-05-15 (×4): 40 mg via INTRAVENOUS
  Filled 2024-05-13 (×4): qty 10

## 2024-05-13 MED ORDER — DEXTROSE-SODIUM CHLORIDE 5-0.9 % IV SOLN: INTRAVENOUS | Status: DC

## 2024-05-13 MED ORDER — METHYLPREDNISOLONE SODIUM SUCC 40 MG IJ SOLR
8.0000 mg | Freq: Once | INTRAMUSCULAR | Status: DC
  Filled 2024-05-13: qty 0.2

## 2024-05-13 NOTE — Progress Notes (Addendum)
 FACULTY PRACTICE ANTEPARTUM COMPREHENSIVE PROGRESS NOTE  Nichole Patterson is a 33 y.o. [id number] at [redacted]w[redacted]d who is admitted for hyperemesis.  Estimated Date of Delivery: 12/19/24    Length of Stay:  3 Days. Admitted 05/10/2024  Subjective: Vomited with PO steroid this morning but wants to eat. Has been refusing IV fluids. Notes reflux/heart burn is bad  Vitals:  Blood pressure 118/64, pulse 72, temperature 97.8 F (36.6 C), temperature source Oral, resp. rate 18, weight 74.2 kg, last menstrual period 03/24/2024, SpO2 100%, unknown if currently breastfeeding. Physical Examination: CONSTITUTIONAL: Well-developed, well-nourished female in no acute distress.  NEUROLOGIC: Alert and oriented to person, place, and time. No cranial nerve deficit noted. PSYCHIATRIC: Normal mood and affect. Normal behavior. Normal judgment and thought content. CARDIOVASCULAR: Normal heart rate noted  RESPIRATORY: nonlabored breathing MUSCULOSKELETAL: Normal range of motion. No edema and no tenderness.  ABDOMEN: Soft, nontender, nondistended   Results for orders placed or performed during the hospital encounter of 05/10/24 (from the past 48 hours)  Magnesium      Status: None   Collection Time: 05/11/24 11:30 AM  Result Value Ref Range   Magnesium  1.9 1.7 - 2.4 mg/dL    Comment: Performed at [facility] Lab, [address], [place], [place] [address]  Basic metabolic panel     Status: Abnormal   Collection Time: 05/12/24  4:13 AM  Result Value Ref Range   Sodium 134 (L) 135 - 145 mmol/L   Potassium 3.6 3.5 - 5.1 mmol/L   Chloride 104 98 - 111 mmol/L   CO2 22 22 - 32 mmol/L   Glucose, Bld 111 (H) 70 - 99 mg/dL    Comment: Glucose reference range applies only to samples taken after fasting for at least 8 hours.   BUN <5 (L) 6 - 20 mg/dL   Creatinine, Ser 9.29 0.44 - 1.00 mg/dL   Calcium 9.3 8.9 - 89.6 mg/dL   GFR, Estimated >39 >39 mL/min    Comment: (NOTE) Calculated using the CKD-EPI Creatinine Equation (2021)     Anion gap 8 5 - 15    Comment: Performed at [facility], [address], [place], [place] [address]    US  MAINE Comp Less 14 Wks Result Date: 05/12/2024 CLINICAL DATA:  Vaginal bleeding, evaluate for known subchorionic hemorrhage EXAM: OBSTETRIC <14 WK ULTRASOUND TECHNIQUE: Transabdominal ultrasound was performed for evaluation of the gestation as well as the maternal uterus and adnexal regions. COMPARISON:  May 01, 2024 FINDINGS: Intrauterine gestational sac: Single Embryo:  Visualized Cardiac Activity: Visualized Heart Rate: 178 bpm CRL:   22.4 mm   8 w 6 d                  US  EDC: 12/16/2024 Subchorionic hemorrhage: There is a moderate amount of subchorionic hematoma proximal to the sac measuring approximately 2.5 x 1.5 x 6.4 cm Maternal uterus/adnexae: Right ovary simple cyst measuring 3 cm.  In IMPRESSION: Single alive live intrauterine gestation with moderate subchorionic hemorrhage. Electronically Signed   By: Megan  Zare M.D.   On: 05/12/2024 11:27    Current scheduled medications  pyridOXINE   25 mg Oral BID   And   doxylamine  (Sleep)  25 mg Oral BID   methylPREDNISolone   16 mg Oral Q breakfast   Followed by   NOREEN ON 05/15/2024] methylPREDNISolone   8 mg Oral Q breakfast   Followed by   NOREEN ON 05/22/2024] methylPREDNISolone   4 mg Oral Q breakfast   [COMPLETED] methylPREDNISolone   16  mg Oral QHS   Followed by   NOREEN ON 05/14/2024] methylPREDNISolone   8 mg Oral QHS   Followed by   NOREEN ON 05/17/2024] methylPREDNISolone   4 mg Oral QHS   methylPREDNISolone   8 mg Oral Q1400   Followed by   NOREEN ON 05/16/2024] methylPREDNISolone   4 mg Oral Q1400   methylPREDNISolone  (SOLU-MEDROL ) injection  16 mg Intravenous BID   methylPREDNISolone  (SOLU-MEDROL ) injection  8 mg Intravenous Once   pantoprazole  (PROTONIX ) IV  40 mg Intravenous Daily   scopolamine   1 patch Transdermal Q72H    I have reviewed the patient's current medications.  ASSESSMENT/PLAN: Principal Problem:    Hyperemesis gravidarum Active Problems:   Prolonged QT interval   Hypokalemia   [redacted] weeks gestation of pregnancy   Ptyalism   Subchorionic hemorrhage  Hyperemesis: Will add protonix  and continue to support patient with symptoms. IV fluids encouraged and discussed need to avoid dehydration -- B6 25 mg BID, doxylamine  25 mg BID -- methylprednisolone  taper -- scopolamine  patch -- hydroxyzine  50 mg q 6 hr prn -- trimethobenzamide  200 mg every 6 hr prn -- glycopyrrolate  0.2 mg q 6 hr prn -- add protonix  (discussed with pharmacy)   2. Prolonged QT: continue to avoid antiemetics that prolong the interval, daily EKG, replete electrolytes prn  3. Hypokalemia: improved, K 3.6 yesterday, has not had labs this morning  4. FEN/GI: IVF ordered but patient refusing- discussed in detail, advance diet as tolerated  Continue routine antenatal care.   Rollo ONEIDA Bring, MD, FACOG Obstetrician & Gynecologist, [facility] for [facility], [facility]  Addendum: Per telemetry, QT interval has been normal since yesterday. EKG deferred at this time.

## 2024-05-14 LAB — BASIC METABOLIC PANEL WITH GFR
Anion gap: 10 (ref 5–15)
BUN: 7 mg/dL (ref 6–20)
CO2: 23 mmol/L (ref 22–32)
Calcium: 9.2 mg/dL (ref 8.9–10.3)
Chloride: 101 mmol/L (ref 98–111)
Creatinine, Ser: 0.68 mg/dL (ref 0.44–1.00)
GFR, Estimated: >60 mL/min (ref >60)
Glucose, Bld: 104 mg/dL — ABNORMAL HIGH (ref 70–99)
Potassium: 2.8 mmol/L — ABNORMAL LOW (ref 3.5–5.1)
Sodium: 134 mmol/L — ABNORMAL LOW (ref 135–145)

## 2024-05-14 LAB — MAGNESIUM: Magnesium: 1.9 mg/dL (ref 1.7–2.4)

## 2024-05-14 MED ORDER — POTASSIUM CHLORIDE CRYS ER 20 MEQ PO TBCR
40.0000 meq | EXTENDED_RELEASE_TABLET | Freq: Two times a day (BID) | ORAL | Status: DC
  Administered 2024-05-14: 40 meq via ORAL
  Filled 2024-05-14: qty 2

## 2024-05-14 MED ORDER — POTASSIUM CHLORIDE 10 MEQ/100ML IV SOLN: 10.0000 meq | INTRAVENOUS | Status: DC

## 2024-05-14 MED ORDER — MAGNESIUM SULFATE 4 GM/100ML IV SOLN
4.0000 g | Freq: Once | INTRAVENOUS | Status: DC
  Filled 2024-05-14: qty 100

## 2024-05-14 MED ORDER — METHYLPREDNISOLONE SODIUM SUCC 40 MG IJ SOLR
16.0000 mg | Freq: Once | INTRAMUSCULAR | Status: DC
  Filled 2024-05-14: qty 0.4

## 2024-05-14 MED ORDER — POTASSIUM CHLORIDE CRYS ER 20 MEQ PO TBCR: 40.0000 meq | EXTENDED_RELEASE_TABLET | Freq: Three times a day (TID) | ORAL | Status: DC

## 2024-05-14 MED ORDER — POTASSIUM CHLORIDE CRYS ER 20 MEQ PO TBCR
40.0000 meq | EXTENDED_RELEASE_TABLET | Freq: Three times a day (TID) | ORAL | Status: DC
  Administered 2024-05-14: 40 meq via ORAL
  Filled 2024-05-14 (×2): qty 2

## 2024-05-14 MED ORDER — THIAMINE HCL 100 MG/ML IJ SOLN
100.0000 mg | Freq: Every day | INTRAMUSCULAR | Status: DC
  Administered 2024-05-15: 100 mg via INTRAVENOUS
  Filled 2024-05-14 (×2): qty 1

## 2024-05-14 MED ORDER — LORAZEPAM 1 MG PO TABS
0.5000 mg | ORAL_TABLET | Freq: Three times a day (TID) | ORAL | Status: DC
  Administered 2024-05-14 – 2024-05-15 (×2): 0.5 mg via ORAL
  Filled 2024-05-14 (×3): qty 1

## 2024-05-14 MED ORDER — LACTATED RINGERS IV SOLN: INTRAVENOUS | Status: DC

## 2024-05-14 MED ORDER — DEXTROSE-SODIUM CHLORIDE 5-0.9 % IV SOLN: INTRAVENOUS | Status: AC

## 2024-05-14 MED ORDER — METHYLPREDNISOLONE SODIUM SUCC 40 MG IJ SOLR
8.0000 mg | Freq: Two times a day (BID) | INTRAMUSCULAR | Status: AC
  Administered 2024-05-14: 8 mg via INTRAVENOUS
  Filled 2024-05-14 (×2): qty 0.2

## 2024-05-14 NOTE — Progress Notes (Addendum)
 FACULTY PRACTICE ANTEPARTUM COMPREHENSIVE PROGRESS NOTE  Nichole Patterson is a 33 y.o. [id number] at [redacted]w[redacted]d who is admitted for hyperemesis.  Estimated Date of Delivery: 12/19/24    Length of Stay:  4 Days. Admitted 05/10/2024  Subjective: Continues to report nausea. Did not vomit last night but did this morning. Started IV potassium but then refused further doses due to burning. Did take it PO.  Vitals:  Blood pressure 138/82, pulse 71, temperature (!) 97.5 F (36.4 C), temperature source Oral, resp. rate 17, weight 74.2 kg, last menstrual period 03/24/2024, SpO2 100%, unknown if currently breastfeeding. Physical Examination: CONSTITUTIONAL: Well-developed, well-nourished female in no acute distress.  NEUROLOGIC: Alert and oriented to person, place, and time. No cranial nerve deficit noted. PSYCHIATRIC: Normal mood and affect. Normal behavior. Normal judgment and thought content. CARDIOVASCULAR: Normal heart rate noted  RESPIRATORY: nonlabored breathing MUSCULOSKELETAL: Normal range of motion. No edema and no tenderness.  ABDOMEN: Soft, nontender, nondistended   Results for orders placed or performed during the hospital encounter of 05/10/24 (from the past 48 hours)  Basic metabolic panel with GFR     Status: Abnormal   Collection Time: 05/13/24 12:14 PM  Result Value Ref Range   Sodium 132 (L) 135 - 145 mmol/L   Potassium 3.1 (L) 3.5 - 5.1 mmol/L   Chloride 99 98 - 111 mmol/L   CO2 20 (L) 22 - 32 mmol/L   Glucose, Bld 118 (H) 70 - 99 mg/dL    Comment: Glucose reference range applies only to samples taken after fasting for at least 8 hours.   BUN 8 6 - 20 mg/dL   Creatinine, Ser 9.30 0.44 - 1.00 mg/dL   Calcium 9.0 8.9 - 89.6 mg/dL   GFR, Estimated >39 >39 mL/min    Comment: (NOTE) Calculated using the CKD-EPI Creatinine Equation (2021)    Anion gap 13 5 - 15    Comment: Performed at [facility], [address] [place], [place] [address]  Magnesium      Status: None   Collection  Time: 05/13/24 12:14 PM  Result Value Ref Range   Magnesium  2.0 1.7 - 2.4 mg/dL    Comment: Performed at [facility], [address], [place], [place] [address]  Basic metabolic panel     Status: Abnormal   Collection Time: 05/14/24  8:58 AM  Result Value Ref Range   Sodium 134 (L) 135 - 145 mmol/L   Potassium 2.8 (L) 3.5 - 5.1 mmol/L   Chloride 101 98 - 111 mmol/L   CO2 23 22 - 32 mmol/L   Glucose, Bld 104 (H) 70 - 99 mg/dL    Comment: Glucose reference range applies only to samples taken after fasting for at least 8 hours.   BUN 7 6 - 20 mg/dL   Creatinine, Ser 9.31 0.44 - 1.00 mg/dL   Calcium 9.2 8.9 - 89.6 mg/dL   GFR, Estimated >39 >39 mL/min    Comment: (NOTE) Calculated using the CKD-EPI Creatinine Equation (2021)    Anion gap 10 5 - 15    Comment: Performed at [facility], [address] [place], [place] [address]  Magnesium      Status: None   Collection Time: 05/14/24  8:58 AM  Result Value Ref Range   Magnesium  1.9 1.7 - 2.4 mg/dL    Comment: Performed at [facility], [address], [place], [place] [address]    US  MAINE Comp Less 14 Wks Result Date: 05/12/2024 CLINICAL  DATA:  Vaginal bleeding, evaluate for known subchorionic hemorrhage EXAM: OBSTETRIC <14 WK ULTRASOUND TECHNIQUE: Transabdominal ultrasound was performed for evaluation of the gestation as well as the maternal uterus and adnexal regions. COMPARISON:  May 01, 2024 FINDINGS: Intrauterine gestational sac: Single Embryo:  Visualized Cardiac Activity: Visualized Heart Rate: 178 bpm CRL:   22.4 mm   8 w 6 d                  US  EDC: 12/16/2024 Subchorionic hemorrhage: There is a moderate amount of subchorionic hematoma proximal to the sac measuring approximately 2.5 x 1.5 x 6.4 cm Maternal uterus/adnexae: Right ovary simple cyst measuring 3 cm.  In IMPRESSION: Single alive live intrauterine gestation with moderate subchorionic hemorrhage. Electronically Signed   By: Megan  Zare M.D.   On: 05/12/2024  11:27    Current scheduled medications  pyridOXINE   25 mg Oral BID   And   doxylamine  (Sleep)  25 mg Oral BID   methylPREDNISolone   16 mg Oral Q breakfast   Followed by   NOREEN ON 05/15/2024] methylPREDNISolone   8 mg Oral Q breakfast   Followed by   NOREEN ON 05/22/2024] methylPREDNISolone   4 mg Oral Q breakfast   methylPREDNISolone   8 mg Oral Q1400   Followed by   NOREEN ON 05/16/2024] methylPREDNISolone   4 mg Oral Q1400   methylPREDNISolone   8 mg Oral QHS   Followed by   NOREEN ON 05/17/2024] methylPREDNISolone   4 mg Oral QHS   methylPREDNISolone  (SOLU-MEDROL ) injection  8 mg Intravenous Once   pantoprazole  (PROTONIX ) IV  40 mg Intravenous Q12H   potassium chloride   20 mEq Oral BID   scopolamine   1 patch Transdermal Q72H    I have reviewed the patient's current medications.  ASSESSMENT/PLAN: Principal Problem:   Hyperemesis gravidarum Active Problems:   Prolonged QT interval   Hypokalemia   [redacted] weeks gestation of pregnancy   Ptyalism   Subchorionic hemorrhage  Hyperemesis: suboptimal control at this time. Current regimen below. Management is complicated by her prolonged QT on admission. Will consult hospitalist for their input on management in light of this. -- B6 25 mg BID, doxylamine  25 mg BID -- methylprednisolone  taper -- scopolamine  patch -- hydroxyzine  50 mg q 6 hr prn -- trimethobenzamide  200 mg every 6 hr prn -- glycopyrrolate  0.2 mg q 6 hr prn -- added protonix  7/15 -- add ativan  0.5 mg TID 7/16   2. Prolonged QT: now resolved, hospitalist consulted for input on appropriate management now that her QT has normalized  3. Hypokalemia: replacement ordered, patient declines IV, PO ordered, if unable to tolerate, will need IV  4. FEN/GI: continue IV fluids, added thiamine   Continue routine antenatal care.   Rollo ONEIDA Bring, MD, FACOG Obstetrician & Gynecologist, [facility] for [facility], [facility]

## 2024-05-14 NOTE — Progress Notes (Signed)
 Notified by RN that patient has been declining medications. Patient seen at bedside with RN. She did tolerate most of the PO potassium. She has been requesting to sleep all day. Discussed with patient importance of medications to correct her electrolyte abnormalities which will then allow us  to have better ability to use antinausea medications. She remains on telemetry at this time. Advised that if she is not complying with advised medications then we aren't actually providing her with any medical care at this time. Encouraged her to allow IVF, potassium correction, IV magnesium  and thiamine  in addition to antinausea medications as ordered. Patient verbalized understanding of plan of care.  Bedside ultrasound confirms +fetal cardiac activity  Rollo ONEIDA Bring, MD, FACOG Obstetrician & Gynecologist, [facility] for [facility], [facility]

## 2024-05-14 NOTE — Progress Notes (Signed)
 This RN rounded on patient at 1610 and offered to provide IV and/or PO medications. Pt continues to refuse all medications at this time and requests to rest. Pt educated on the indication for medications. Pt remains opposed to medications but is open to RN finding fetal heart tones with doppler.

## 2024-05-14 NOTE — Final Consult Note (Signed)
 Initial Consultation Note   Patient: Nichole Patterson FMW:[id number] DOB: 1991/07/20 PCP: Pcp, No DOA: 05/10/2024 DOS: the patient was seen and examined on 05/14/2024 Primary service: Abigail Rollo DASEN, MD  Referring physician: Abigail Rollo Reason for consult: Hyperemesis, abnl EKG, and abnl electrolytes  Assessment/Plan: Assessment and Plan: 59F admitted with hyperemesis gravidarum c/b hypokalemia and QTc prolongation.  Hypokalemia QTc prolongation Hyperemesis gravidarum -Typically pt with prolonged Qtc pt are given ativan  for nausea, but primary team (OB/GYN) will need to determine if this is indicated in this patient population -Advise repletion with IV Mg 4mg  x1 and PO Kdur 40mEq TID x 3 doses q6-8h with repeat EKG once lytes wnl to determine if this is the etiology of pt's prolonged Qtc at which point alternative antiemetics such as zofran  and phenergan  can be considered   TRH will sign off at present, please call us  again when needed.  HPI: Nichole Patterson is a 33 y.o. female admitted with hyperemesis gravidarum c/b hypokalemia and QTc prolongation.  Review of Systems: As mentioned in the history of present illness. All other systems reviewed and are negative. Past Medical History:  Diagnosis Date   Asthma    Chlamydia    Headache    Heart murmur    Trichomonas contact    Past Surgical History:  Procedure Laterality Date   CESAREAN SECTION N/A 11/18/2018   Procedure: CESAREAN SECTION;  Surgeon: Izell Harari, MD;  Location: [facility] BIRTHING SUIT[facility];  Service: Obstetrics;  Laterality: N/A;   MOUTH SURGERY     Social History:  reports that she has never smoked. She has never used smokeless tobacco. She reports that she does not drink alcohol and does not use drugs.  Allergies  Allergen Reactions   Food Hives    Salmon   Latex Itching    Redness    Family History  Problem Relation Age of Onset   Pneumonia Father     Prior to Admission medications   Medication Sig Start Date  End Date Taking? Authorizing Provider  famotidine  (PEPCID ) 40 MG tablet Take 1 tablet (40 mg total) by mouth daily. 05/02/24   Cresenzo, John V, MD  metoCLOPramide  (REGLAN ) 10 MG tablet Take 1 tablet (10 mg total) by mouth every 8 (eight) hours as needed for nausea. 05/26/22   Marylen Aleck HERO, CNM  ondansetron  (ZOFRAN -ODT) 8 MG disintegrating tablet Take 1 tablet (8 mg total) by mouth every 8 (eight) hours as needed for nausea or vomiting. 05/26/22   Marylen Aleck HERO, CNM  Prenatal Vit-Fe Phos-FA-Omega (VITAFOL  GUMMIES) 3.33-0.333-34.8 MG CHEW Chew 1 tablet by mouth daily. 05/24/22   Weinhold, Samantha C, CNM  promethazine  (PHENERGAN ) 12.5 MG tablet Take 1 tablet (12.5 mg total) by mouth every 6 (six) hours as needed for nausea or vomiting. 05/26/22   Marylen Aleck HERO, CNM  scopolamine  (TRANSDERM-SCOP) 1 MG/3DAYS Place 1 patch (1.5 mg total) onto the skin every 3 (three) days. 05/04/24   Ilean Norleen GAILS, MD    Physical Exam: Vitals:   05/14/24 0018 05/14/24 0348 05/14/24 0817 05/14/24 1303  BP: 127/64 (!) 111/57 138/82 (!) 108/59  Pulse: 77 75 71 78  Resp: 16 16 17 16   Temp: 98.3 F (36.8 C) 98.1 F (36.7 C) (!) 97.5 F (36.4 C) 98.7 F (37.1 C)  TempSrc: Oral Oral Oral Oral  SpO2: 100% 100% 100% 100%  Weight:       General: Alert, oriented x3, resting comfortably in no acute distress Respiratory: Lungs clear to auscultation bilaterally with normal  respiratory effort; no w/r/r Cardiovascular: Regular rate and rhythm w/o m/r/g   Data Reviewed:   Lab Results  Component Value Date   WBC 17.4 (H) 05/10/2024   HGB 15.0 05/10/2024   HCT 45.0 05/10/2024   MCV 81.8 05/10/2024   PLT 288 05/10/2024   Lab Results  Component Value Date   GLUCOSE 104 (H) 05/14/2024   CALCIUM 9.2 05/14/2024   NA 134 (L) 05/14/2024   K 2.8 (L) 05/14/2024   CO2 23 05/14/2024   CL 101 05/14/2024   BUN 7 05/14/2024   CREATININE 0.68 05/14/2024   Lab Results  Component Value Date   ALT 14 05/10/2024    AST 13 (L) 05/10/2024   ALKPHOS 95 05/10/2024   BILITOT 0.7 05/10/2024   No results found for: INR, PROTIME  Radiology: No results found.   Family Communication: N/A Primary team communication: Secure chat messaged Dr. Abigail Thank you very much for involving us  in the care of your patient.  Author: Marsha Ada, MD 05/14/2024 2:52 PM  For on call review [email or web address].

## 2024-05-14 NOTE — Progress Notes (Signed)
 40 mEQ PO Potassium given at 1340. Pt requests time to rest and refuses other medications at this time.

## 2024-05-14 NOTE — Progress Notes (Signed)
 This RN rounded on patient at 1130. Pt alert and oriented. RN asked if pt would be amenable to medication at this time. Pt refused all medications including IV medications. Pt requests to take medications at a later time.

## 2024-05-15 LAB — BASIC METABOLIC PANEL WITH GFR
Anion gap: 9 (ref 5–15)
BUN: 7 mg/dL (ref 6–20)
CO2: 21 mmol/L — ABNORMAL LOW (ref 22–32)
Calcium: 9 mg/dL (ref 8.9–10.3)
Chloride: 101 mmol/L (ref 98–111)
Creatinine, Ser: 0.63 mg/dL (ref 0.44–1.00)
GFR, Estimated: >60 mL/min (ref >60)
Glucose, Bld: 84 mg/dL (ref 70–99)
Potassium: 4.5 mmol/L (ref 3.5–5.1)
Sodium: 131 mmol/L — ABNORMAL LOW (ref 135–145)

## 2024-05-15 LAB — MAGNESIUM: Magnesium: 1.9 mg/dL (ref 1.7–2.4)

## 2024-05-15 MED ORDER — GLYCOPYRROLATE 1 MG PO TABS: 2.0000 mg | ORAL_TABLET | Freq: Three times a day (TID) | ORAL | Status: DC | PRN

## 2024-05-15 MED ORDER — THIAMINE MONONITRATE 100 MG PO TABS
100.0000 mg | ORAL_TABLET | Freq: Every day | ORAL | Status: DC
  Administered 2024-05-16: 100 mg via ORAL
  Filled 2024-05-15: qty 1

## 2024-05-15 MED ORDER — LORAZEPAM 1 MG PO TABS: 0.5000 mg | ORAL_TABLET | Freq: Three times a day (TID) | ORAL | Status: DC | PRN

## 2024-05-15 MED ORDER — PANTOPRAZOLE SODIUM 40 MG PO TBEC
40.0000 mg | DELAYED_RELEASE_TABLET | Freq: Every day | ORAL | Status: DC
  Administered 2024-05-16: 40 mg via ORAL
  Filled 2024-05-15: qty 1

## 2024-05-15 NOTE — Plan of Care (Signed)
  Problem: Education: Goal: Knowledge of disease or condition will improve Outcome: Progressing Goal: Knowledge of the prescribed therapeutic regimen will improve Outcome: Progressing   Problem: Clinical Measurements: Goal: Complications related to the disease process, condition or treatment will be avoided or minimized Outcome: Progressing   Problem: Education: Goal: Knowledge of disease or condition will improve Outcome: Progressing Goal: Knowledge of the prescribed therapeutic regimen will improve Outcome: Progressing   Problem: Bowel/Gastric: Goal: Occurences of nausea and/or vomiting will decrease Outcome: Progressing   Problem: Fluid Volume: Goal: Maintenance of adequate hydration will improve Outcome: Progressing   Problem: Nutritional: Goal: Achievement of adequate weight for body size and type will improve Outcome: Progressing   Problem: Health Behavior/Discharge Planning: Goal: Ability to manage health-related needs will improve Outcome: Progressing   Problem: Clinical Measurements: Goal: Ability to maintain clinical measurements within normal limits will improve Outcome: Progressing Goal: Will remain free from infection Outcome: Progressing

## 2024-05-15 NOTE — Progress Notes (Signed)
 FACULTY PRACTICE ANTEPARTUM COMPREHENSIVE PROGRESS NOTE  Nichole Patterson is a 33 y.o. [id number] at [redacted]w[redacted]d who is admitted for hyperemesis.  Estimated Date of Delivery: 12/19/24   Length of Stay:  5 Days. Admitted 05/10/2024  Subjective: Doing a little better. Was able to tolerate muffins with IV  meds. Vomited a little when she took a pill with nothing else in her stomach. Feels comfortable switching to all PO meds and would be open to discharge home tomorrow if she is still tolerating PO intake with PO meds  Vitals:  Blood pressure 102/60, pulse 88, temperature 98.1 F (36.7 C), temperature source Oral, resp. rate 17, weight 74.2 kg, last menstrual period 03/24/2024, SpO2 100%, unknown if currently breastfeeding. Physical Examination: CONSTITUTIONAL: Well-developed, well-nourished female in no acute distress.  NEUROLOGIC: Alert and oriented to person, place, and time. No cranial nerve deficit noted. PSYCHIATRIC: Normal mood and affect. Normal behavior. Normal judgment and thought content. CARDIOVASCULAR: Normal heart rate noted  RESPIRATORY: nonlabored breathing MUSCULOSKELETAL: Normal range of motion. No edema and no tenderness.  ABDOMEN: Soft, nontender, nondistended   Results for orders placed or performed during the hospital encounter of 05/10/24 (from the past 48 hours)  Basic metabolic panel     Status: Abnormal   Collection Time: 05/14/24  8:58 AM  Result Value Ref Range   Sodium 134 (L) 135 - 145 mmol/L   Potassium 2.8 (L) 3.5 - 5.1 mmol/L   Chloride 101 98 - 111 mmol/L   CO2 23 22 - 32 mmol/L   Glucose, Bld 104 (H) 70 - 99 mg/dL    Comment: Glucose reference range applies only to samples taken after fasting for at least 8 hours.   BUN 7 6 - 20 mg/dL   Creatinine, Ser 9.31 0.44 - 1.00 mg/dL   Calcium 9.2 8.9 - 89.6 mg/dL   GFR, Estimated >39 >39 mL/min    Comment: (NOTE) Calculated using the CKD-EPI Creatinine Equation (2021)    Anion gap 10 5 - 15    Comment: Performed at [facility], [address] [place], [place] [address]  Magnesium      Status: None   Collection Time: 05/14/24  8:58 AM  Result Value Ref Range   Magnesium  1.9 1.7 - 2.4 mg/dL    Comment: Performed at [facility], [address], [place], [place] [address]  Basic metabolic panel with GFR     Status: Abnormal   Collection Time: 05/15/24  8:25 AM  Result Value Ref Range   Sodium 131 (L) 135 - 145 mmol/L   Potassium 4.5 3.5 - 5.1 mmol/L    Comment: HEMOLYSIS AT THIS LEVEL MAY AFFECT RESULT   Chloride 101 98 - 111 mmol/L   CO2 21 (L) 22 - 32 mmol/L   Glucose, Bld 84 70 - 99 mg/dL    Comment: Glucose reference range applies only to samples taken after fasting for at least 8 hours.   BUN 7 6 - 20 mg/dL   Creatinine, Ser 9.36 0.44 - 1.00 mg/dL   Calcium 9.0 8.9 - 89.6 mg/dL   GFR, Estimated >39 >39 mL/min    Comment: (NOTE) Calculated using the CKD-EPI Creatinine Equation (2021)    Anion gap 9 5 - 15    Comment: Performed at [facility], [address] [place], [place] [address]  Magnesium      Status: None   Collection Time: 05/15/24  8:25 AM  Result Value Ref Range   Magnesium  1.9 1.7 -  2.4 mg/dL    Comment: Performed at [facility], [address], [place], [place] [address]    No results found.   Current scheduled medications  pyridOXINE   25 mg Oral BID   And   doxylamine  (Sleep)  25 mg Oral BID   LORazepam   0.5 mg Oral TID   methylPREDNISolone   8 mg Oral Q breakfast   Followed by   NOREEN ON 05/22/2024] methylPREDNISolone   4 mg Oral Q breakfast   methylPREDNISolone   8 mg Oral Q1400   Followed by   NOREEN ON 05/16/2024] methylPREDNISolone   4 mg Oral Q1400   methylPREDNISolone   8 mg Oral QHS   Followed by   NOREEN ON 05/17/2024] methylPREDNISolone   4 mg Oral QHS   methylPREDNISolone  (SOLU-MEDROL ) injection  16 mg Intravenous Once   methylPREDNISolone  (SOLU-MEDROL ) injection  8 mg Intravenous Once   [START ON 05/16/2024] pantoprazole   40 mg Oral Daily    scopolamine   1 patch Transdermal Q72H   [START ON 05/16/2024] thiamine   100 mg Oral Daily   I have reviewed the patient's current medications.  ASSESSMENT/PLAN: Principal Problem:   Hyperemesis gravidarum Active Problems:   Prolonged QT interval   Hypokalemia   [redacted] weeks gestation of pregnancy   Ptyalism   Subchorionic hemorrhage  Hyperemesis, improving.  Current regimen below Repeat EKG today with resolution of prolonged QT interval -- B6 25 mg BID, doxylamine  25 mg BID -- methylprednisolone  taper -- scopolamine  patch -- hydroxyzine  50 mg q 6 hr prn -- trimethobenzamide  200 mg every 6 hr prn -- glycopyrrolate  0.2 mg q 6 hr prn -- added protonix  7/15 -- add ativan  0.5 mg TID prn 7/16 Will transition to all POs today. If able to tolerate regular diet, anticipate discharge home tomorrow. Can add zofran /phenergan  if needed.    2. Prolonged QT: now resolved on EKG today  3. Hypokalemia: s/p repletion, K 4.5 this AM  4. FEN/GI: will stop IV fluids now that patient is tolerating PO  Continue routine antenatal care.  Kieth JAYSON Carolin, MD, FACOG Obstetrician & Gynecologist, [facility], [facility]

## 2024-05-16 ENCOUNTER — Encounter (HOSPITAL_COMMUNITY): Payer: Self-pay | Admitting: Obstetrics and Gynecology

## 2024-05-16 ENCOUNTER — Other Ambulatory Visit (HOSPITAL_COMMUNITY): Payer: Self-pay

## 2024-05-16 DIAGNOSIS — Z3A09 9 weeks gestation of pregnancy: Secondary | ICD-10-CM

## 2024-05-16 LAB — BASIC METABOLIC PANEL WITH GFR
Anion gap: 8 (ref 5–15)
BUN: 10 mg/dL (ref 6–20)
CO2: 22 mmol/L (ref 22–32)
Calcium: 9 mg/dL (ref 8.9–10.3)
Chloride: 101 mmol/L (ref 98–111)
Creatinine, Ser: 0.59 mg/dL (ref 0.44–1.00)
GFR, Estimated: >60 mL/min (ref >60)
Glucose, Bld: 104 mg/dL — ABNORMAL HIGH (ref 70–99)
Potassium: 3.9 mmol/L (ref 3.5–5.1)
Sodium: 131 mmol/L — ABNORMAL LOW (ref 135–145)

## 2024-05-16 LAB — MAGNESIUM: Magnesium: 1.9 mg/dL (ref 1.7–2.4)

## 2024-05-16 MED ORDER — CAPSAICIN 0.075 % EX CREA
TOPICAL_CREAM | Freq: Two times a day (BID) | CUTANEOUS | 0 refills | Status: AC | PRN
  Filled 2024-05-16: qty 28.3, fill #0

## 2024-05-16 MED ORDER — DOXYLAMINE SUCCINATE (SLEEP) 25 MG PO TABS
25.0000 mg | ORAL_TABLET | Freq: Two times a day (BID) | ORAL | 0 refills | Status: AC
  Filled 2024-05-16: qty 30, 15d supply, fill #0

## 2024-05-16 MED ORDER — METHYLPREDNISOLONE 4 MG PO TABS
ORAL_TABLET | ORAL | 0 refills | Status: AC
  Filled 2024-05-16: qty 20, 10d supply, fill #0

## 2024-05-16 MED ORDER — SCOPOLAMINE 1 MG/3DAYS TD PT72
1.0000 | MEDICATED_PATCH | TRANSDERMAL | 0 refills | Status: AC
  Filled 2024-05-16: qty 10, 30d supply, fill #0

## 2024-05-16 MED ORDER — METOCLOPRAMIDE HCL 10 MG PO TABS
10.0000 mg | ORAL_TABLET | Freq: Four times a day (QID) | ORAL | 0 refills | Status: AC | PRN
  Filled 2024-05-16: qty 90, 23d supply, fill #0

## 2024-05-16 MED ORDER — PANTOPRAZOLE SODIUM 40 MG PO TBEC
40.0000 mg | DELAYED_RELEASE_TABLET | Freq: Every day | ORAL | 0 refills | Status: AC
  Filled 2024-05-16: qty 30, 30d supply, fill #0

## 2024-05-16 MED ORDER — PYRIDOXINE HCL 25 MG PO TABS
25.0000 mg | ORAL_TABLET | Freq: Two times a day (BID) | ORAL | 0 refills | Status: AC
  Filled 2024-05-16: qty 60, 30d supply, fill #0

## 2024-05-16 MED ORDER — GLYCOPYRROLATE 1 MG PO TABS
2.0000 mg | ORAL_TABLET | Freq: Three times a day (TID) | ORAL | 0 refills | Status: AC | PRN
  Filled 2024-05-16: qty 90, 15d supply, fill #0

## 2024-05-16 NOTE — Discharge Summary (Signed)
 Antenatal Physician Discharge Summary  Patient ID: Nichole Patterson MRN: [id number] DOB/AGE: 32/12/1990 32 y.o.  Admit date: 05/10/2024 Discharge date: 05/16/2024  Admission Diagnoses: Dehydration [E86.0] Hypokalemia [E87.6] Hyperemesis gravidarum [O21.0] Prolonged Q-T interval on ECG [R94.31] Nausea and vomiting, unspecified vomiting type [R11.2]  Discharge Diagnoses:  Hyperemesis gravidarum SLIUP at [redacted]w[redacted]d  Hospital Course:  Nichole Patterson is a 33 y.o. G5P4004 at [redacted]w[redacted]d admitted for hyperemesis gravidarum, electrolyte derangements and prolonged QT interval now stable and ready for discharge home  Patient was admitted with intractable nausea/vomiting, K 2.9 and prolonged QT interval. Medicine and pharmacy were consulted to assist with management. Her prolonged QT interval resolved with electrolyte repletion. She was ultimately stabilized with medrol  taper, robinul  prn, ativan  prn, B6/unisom  BID, protonix  and thiamine . On day of discharge, she was tolerating all PO medications and tolerating a regular diet. She was discharged home in good condition. She is unsure of her plans for this pregnancy so options were reviewed and contact information was provided.   Discharge Exam: Temp:  [98.1 F (36.7 C)-98.5 F (36.9 C)] 98.1 F (36.7 C) (07/18 0955) Pulse Rate:  [73-93] 83 (07/18 0955) Resp:  [16-17] 16 (07/18 0955) BP: (95-113)/(50-54) 101/54 (07/18 0955) SpO2:  [99 %-100 %] 100 % (07/18 0955) Physical Examination: CONSTITUTIONAL: Well-developed, well-nourished female in no acute distress.  CARDIOVASCULAR: Normal heart rate noted RESPIRATORY: Effort normal, no problems with respiration noted  Significant Diagnostic Studies:  Results for orders placed or performed during the hospital encounter of 05/10/24 (from the past week)  Lipase, blood   Collection Time: 05/10/24  7:51 AM  Result Value Ref Range   Lipase 21 11 - 51 U/L  Comprehensive metabolic panel   Collection Time: 05/10/24  7:51 AM   Result Value Ref Range   Sodium 136 135 - 145 mmol/L   Potassium 2.9 (L) 3.5 - 5.1 mmol/L   Chloride 91 (L) 98 - 111 mmol/L   CO2 22 22 - 32 mmol/L   Glucose, Bld 103 (H) 70 - 99 mg/dL   BUN 12 6 - 20 mg/dL   Creatinine, Ser 9.28 0.44 - 1.00 mg/dL   Calcium 89.3 (H) 8.9 - 10.3 mg/dL   Total Protein 8.6 (H) 6.5 - 8.1 g/dL   Albumin 5.1 (H) 3.5 - 5.0 g/dL   AST 13 (L) 15 - 41 U/L   ALT 14 0 - 44 U/L   Alkaline Phosphatase 95 38 - 126 U/L   Total Bilirubin 0.7 0.0 - 1.2 mg/dL   GFR, Estimated >39 >39 mL/min   Anion gap 22 (H) 5 - 15  CBC   Collection Time: 05/10/24  7:51 AM  Result Value Ref Range   WBC 17.4 (H) 4.0 - 10.5 K/uL   RBC 5.50 (H) 3.87 - 5.11 MIL/uL   Hemoglobin 15.0 12.0 - 15.0 g/dL   HCT 54.9 63.9 - 53.9 %   MCV 81.8 80.0 - 100.0 fL   MCH 27.3 26.0 - 34.0 pg   MCHC 33.3 30.0 - 36.0 g/dL   RDW 86.3 88.4 - 84.4 %   Platelets 288 150 - 400 K/uL   nRBC 0.0 0.0 - 0.2 %  Magnesium    Collection Time: 05/10/24  7:51 AM  Result Value Ref Range   Magnesium  2.2 1.7 - 2.4 mg/dL  Rapid urine drug screen (hospital performed)   Collection Time: 05/11/24  6:20 AM  Result Value Ref Range   Opiates NONE DETECTED NONE DETECTED   Cocaine NONE DETECTED NONE DETECTED   Benzodiazepines  NONE DETECTED NONE DETECTED   Amphetamines NONE DETECTED NONE DETECTED   Tetrahydrocannabinol POSITIVE (A) NONE DETECTED   Barbiturates NONE DETECTED NONE DETECTED  Urinalysis, Routine w reflex microscopic -Urine, Clean Catch   Collection Time: 05/11/24  6:20 AM  Result Value Ref Range   Color, Urine YELLOW YELLOW   APPearance CLEAR CLEAR   Specific Gravity, Urine 1.019 1.005 - 1.030   pH 6.0 5.0 - 8.0   Glucose, UA NEGATIVE NEGATIVE mg/dL   Hgb urine dipstick NEGATIVE NEGATIVE   Bilirubin Urine NEGATIVE NEGATIVE   Ketones, ur 20 (A) NEGATIVE mg/dL   Protein, ur NEGATIVE NEGATIVE mg/dL   Nitrite NEGATIVE NEGATIVE   Leukocytes,Ua NEGATIVE NEGATIVE  Urinalysis, Routine w reflex microscopic  -   Collection Time: 05/11/24  6:20 AM  Result Value Ref Range   Color, Urine YELLOW YELLOW   APPearance CLEAR CLEAR   Specific Gravity, Urine 1.018 1.005 - 1.030   pH 6.0 5.0 - 8.0   Glucose, UA NEGATIVE NEGATIVE mg/dL   Hgb urine dipstick NEGATIVE NEGATIVE   Bilirubin Urine NEGATIVE NEGATIVE   Ketones, ur 20 (A) NEGATIVE mg/dL   Protein, ur NEGATIVE NEGATIVE mg/dL   Nitrite NEGATIVE NEGATIVE   Leukocytes,Ua NEGATIVE NEGATIVE  Basic metabolic panel   Collection Time: 05/11/24  8:59 AM  Result Value Ref Range   Sodium 132 (L) 135 - 145 mmol/L   Potassium 2.8 (L) 3.5 - 5.1 mmol/L   Chloride 102 98 - 111 mmol/L   CO2 20 (L) 22 - 32 mmol/L   Glucose, Bld 115 (H) 70 - 99 mg/dL   BUN 6 6 - 20 mg/dL   Creatinine, Ser 9.27 0.44 - 1.00 mg/dL   Calcium 9.0 8.9 - 89.6 mg/dL   GFR, Estimated >39 >39 mL/min   Anion gap 10 5 - 15  T4, free   Collection Time: 05/11/24  8:59 AM  Result Value Ref Range   Free T4 1.80 (H) 0.61 - 1.12 ng/dL  TSH   Collection Time: 05/11/24  8:59 AM  Result Value Ref Range   TSH 0.052 (L) 0.350 - 4.500 uIU/mL  T3, free   Collection Time: 05/11/24  8:59 AM  Result Value Ref Range   T3, Free 2.8 2.0 - 4.4 pg/mL  Magnesium    Collection Time: 05/11/24 11:30 AM  Result Value Ref Range   Magnesium  1.9 1.7 - 2.4 mg/dL  Basic metabolic panel   Collection Time: 05/12/24  4:13 AM  Result Value Ref Range   Sodium 134 (L) 135 - 145 mmol/L   Potassium 3.6 3.5 - 5.1 mmol/L   Chloride 104 98 - 111 mmol/L   CO2 22 22 - 32 mmol/L   Glucose, Bld 111 (H) 70 - 99 mg/dL   BUN <5 (L) 6 - 20 mg/dL   Creatinine, Ser 9.29 0.44 - 1.00 mg/dL   Calcium 9.3 8.9 - 89.6 mg/dL   GFR, Estimated >39 >39 mL/min   Anion gap 8 5 - 15  Basic metabolic panel with GFR   Collection Time: 05/13/24 12:14 PM  Result Value Ref Range   Sodium 132 (L) 135 - 145 mmol/L   Potassium 3.1 (L) 3.5 - 5.1 mmol/L   Chloride 99 98 - 111 mmol/L   CO2 20 (L) 22 - 32 mmol/L   Glucose, Bld 118 (H)  70 - 99 mg/dL   BUN 8 6 - 20 mg/dL   Creatinine, Ser 9.30 0.44 - 1.00 mg/dL   Calcium 9.0 8.9 - 10.3  mg/dL   GFR, Estimated >39 >39 mL/min   Anion gap 13 5 - 15  Magnesium    Collection Time: 05/13/24 12:14 PM  Result Value Ref Range   Magnesium  2.0 1.7 - 2.4 mg/dL  Basic metabolic panel   Collection Time: 05/14/24  8:58 AM  Result Value Ref Range   Sodium 134 (L) 135 - 145 mmol/L   Potassium 2.8 (L) 3.5 - 5.1 mmol/L   Chloride 101 98 - 111 mmol/L   CO2 23 22 - 32 mmol/L   Glucose, Bld 104 (H) 70 - 99 mg/dL   BUN 7 6 - 20 mg/dL   Creatinine, Ser 9.31 0.44 - 1.00 mg/dL   Calcium 9.2 8.9 - 89.6 mg/dL   GFR, Estimated >39 >39 mL/min   Anion gap 10 5 - 15  Magnesium    Collection Time: 05/14/24  8:58 AM  Result Value Ref Range   Magnesium  1.9 1.7 - 2.4 mg/dL  Basic metabolic panel with GFR   Collection Time: 05/15/24  8:25 AM  Result Value Ref Range   Sodium 131 (L) 135 - 145 mmol/L   Potassium 4.5 3.5 - 5.1 mmol/L   Chloride 101 98 - 111 mmol/L   CO2 21 (L) 22 - 32 mmol/L   Glucose, Bld 84 70 - 99 mg/dL   BUN 7 6 - 20 mg/dL   Creatinine, Ser 9.36 0.44 - 1.00 mg/dL   Calcium 9.0 8.9 - 89.6 mg/dL   GFR, Estimated >39 >39 mL/min   Anion gap 9 5 - 15  Magnesium    Collection Time: 05/15/24  8:25 AM  Result Value Ref Range   Magnesium  1.9 1.7 - 2.4 mg/dL  Basic metabolic panel   Collection Time: 05/16/24  4:41 AM  Result Value Ref Range   Sodium 131 (L) 135 - 145 mmol/L   Potassium 3.9 3.5 - 5.1 mmol/L   Chloride 101 98 - 111 mmol/L   CO2 22 22 - 32 mmol/L   Glucose, Bld 104 (H) 70 - 99 mg/dL   BUN 10 6 - 20 mg/dL   Creatinine, Ser 9.40 0.44 - 1.00 mg/dL   Calcium 9.0 8.9 - 89.6 mg/dL   GFR, Estimated >39 >39 mL/min   Anion gap 8 5 - 15  Magnesium    Collection Time: 05/16/24  4:41 AM  Result Value Ref Range   Magnesium  1.9 1.7 - 2.4 mg/dL   US  OB Comp Less 14 Wks Result Date: 05/12/2024 CLINICAL DATA:  Vaginal bleeding, evaluate for known subchorionic hemorrhage  EXAM: OBSTETRIC <14 WK ULTRASOUND TECHNIQUE: Transabdominal ultrasound was performed for evaluation of the gestation as well as the maternal uterus and adnexal regions. COMPARISON:  May 01, 2024 FINDINGS: Intrauterine gestational sac: Single Embryo:  Visualized Cardiac Activity: Visualized Heart Rate: 178 bpm CRL:   22.4 mm   8 w 6 d                  US  EDC: 12/16/2024 Subchorionic hemorrhage: There is a moderate amount of subchorionic hematoma proximal to the sac measuring approximately 2.5 x 1.5 x 6.4 cm Maternal uterus/adnexae: Right ovary simple cyst measuring 3 cm.  In IMPRESSION: Single alive live intrauterine gestation with moderate subchorionic hemorrhage. Electronically Signed   By: Megan  Zare M.D.   On: 05/12/2024 11:27   US  OB Comp < 14 Wks Result Date: 05/01/2024 CLINICAL DATA:  abd pain early preg. EXAM: OBSTETRIC <14 WK ULTRASOUND TECHNIQUE: Transabdominal ultrasound was performed for evaluation of the gestation as well as  the maternal uterus and adnexal regions. COMPARISON:  None Available. FINDINGS: Intrauterine gestational sac: Single Yolk sac:  Visualized. Embryo:  Visualized. Cardiac Activity: Visualized. Heart Rate: 150 bpm CRL:   8.7 mm   6 w 6 d                  US  EDC: 12/19/2024. Subchorionic hemorrhage: There is a small anechoic subchorionic hemorrhage occupying less than 25% of the circumference of the gestational sac. Maternal uterus/adnexae: A simple cyst noted in the right ovary measuring up to 2.7 x 2.9 x 3.0 cm. Bilateral ovaries are otherwise within normal limits. IMPRESSION: *Single live intrauterine gestation with crown-rump length corresponding to 6 weeks 6 days. Electronically Signed   By: Ree Molt M.D.   On: 05/01/2024 11:47    No future appointments.  Discharge Condition: Stable  Discharge disposition: 01-Home or Self Care       Discharge Instructions     Call MD for:  persistant nausea and vomiting   Complete by: As directed    Call MD for:  severe  uncontrolled pain   Complete by: As directed    Call MD for:  temperature >100.4   Complete by: As directed    Diet - low sodium heart healthy   Complete by: As directed    Increase activity slowly   Complete by: As directed       Allergies as of 05/16/2024       Reactions   Food Hives   Salmon   Latex Itching   Redness        Medication List     STOP taking these medications    ondansetron  8 MG disintegrating tablet Commonly known as: ZOFRAN -ODT   promethazine  12.5 MG tablet Commonly known as: PHENERGAN        TAKE these medications    capsicum 0.075 % topical cream Commonly known as: ZOSTRIX Apply topically 2 (two) times daily as needed (cannabinoid-induced nausea).   doxylamine  (Sleep) 25 MG tablet Commonly known as: UNISOM  Take 1 tablet (25 mg total) by mouth 2 (two) times daily.   famotidine  40 MG tablet Commonly known as: PEPCID  Take 1 tablet (40 mg total) by mouth daily.   glycopyrrolate  1 MG tablet Commonly known as: ROBINUL  Take 2 tablets (2 mg total) by mouth 3 (three) times daily as needed (spitting).   methylPREDNISolone  4 MG tablet Commonly known as: Medrol  Take 2 tablets (8 mg total) by mouth 2 (two) times daily for 2 days, THEN 2 tablets (8 mg total) in the morning for 4 days, THEN 1 tablet (4 mg total) in the morning for 4 days. Start taking on: May 16, 2024   metoCLOPramide  10 MG tablet Commonly known as: REGLAN  Take 1 tablet (10 mg total) by mouth every 6 (six) hours as needed for nausea. What changed: when to take this   pantoprazole  40 MG tablet Commonly known as: PROTONIX  Take 1 tablet (40 mg total) by mouth daily. Start taking on: May 17, 2024   pyridOXINE  25 MG tablet Commonly known as: VITAMIN B6 Take 1 tablet (25 mg total) by mouth 2 (two) times daily.   scopolamine  1 MG/3DAYS Commonly known as: TRANSDERM-SCOP Place 1 patch (1.5 mg total) onto the skin every 3 (three) days.   Vitafol  Gummies 3.33-0.333-34.8 MG  Chew Chew 1 tablet by mouth daily.        Total discharge time: 30 minutes   Signed: Kieth JAYSON Carolin M.D. 05/16/2024, 2:19 PM

## 2024-05-16 NOTE — Plan of Care (Signed)
  Problem: Education: Goal: Knowledge of disease or condition will improve Outcome: Adequate for Discharge Goal: Knowledge of the prescribed therapeutic regimen will improve Outcome: Adequate for Discharge   Problem: Clinical Measurements: Goal: Complications related to the disease process, condition or treatment will be avoided or minimized Outcome: Adequate for Discharge   Problem: Education: Goal: Knowledge of disease or condition will improve Outcome: Adequate for Discharge Goal: Knowledge of the prescribed therapeutic regimen will improve Outcome: Adequate for Discharge   Problem: Bowel/Gastric: Goal: Occurences of nausea and/or vomiting will decrease Outcome: Adequate for Discharge   Problem: Fluid Volume: Goal: Maintenance of adequate hydration will improve Outcome: Adequate for Discharge   Problem: Nutritional: Goal: Achievement of adequate weight for body size and type will improve Outcome: Adequate for Discharge   Problem: Health Behavior/Discharge Planning: Goal: Ability to manage health-related needs will improve Outcome: Adequate for Discharge   Problem: Clinical Measurements: Goal: Ability to maintain clinical measurements within normal limits will improve Outcome: Adequate for Discharge Goal: Will remain free from infection Outcome: Adequate for Discharge Goal: Diagnostic test results will improve Outcome: Adequate for Discharge   Problem: Activity: Goal: Risk for activity intolerance will decrease Outcome: Adequate for Discharge   Problem: Nutrition: Goal: Adequate nutrition will be maintained Outcome: Adequate for Discharge   Problem: Coping: Goal: Level of anxiety will decrease Outcome: Adequate for Discharge   Problem: Elimination: Goal: Will not experience complications related to bowel motility Outcome: Adequate for Discharge Goal: Will not experience complications related to urinary retention Outcome: Adequate for Discharge   Problem:  Pain Managment: Goal: General experience of comfort will improve and/or be controlled Outcome: Adequate for Discharge   Problem: Safety: Goal: Ability to remain free from injury will improve Outcome: Adequate for Discharge   Problem: Skin Integrity: Goal: Risk for impaired skin integrity will decrease Outcome: Adequate for Discharge

## 2024-05-16 NOTE — Discharge Instructions (Signed)
 [facility] [employer] for [employer] at [employer] [facility]             [address], [place], [place] [address] [phone]  Center for [facility] at [facility]                                                             [address], [place], [place], [address] [phone]  Center for [facility] at [facility] [address], [place], [place], [address] [phone]  Center for [facility] at [facility] [address], [place], [place], [address] [phone]  Center for [facility]                                 [address], [place], [place], [address] [phone]  Center for [facility] at [facility]                                    [address] [place], [place], [address] [phone]  Center for [facility] at [facility] [address], [place], [place], [address]                              [facility] [address], [address], [place], [place], [address] [phone]  [facility]         Phone: [phone]  [facility]      Phone: [phone]   [facility]      Phone: [phone]  [facility]         Phone: [phone]   [facility]-M[facility]    Phone: [phone]  [facility]      Phone: [phone]  [facility]     Phone: [phone]  [facility]        Phone: [phone]  [facility]) [phone]  [facility]      Phone: [phone]    [facility] - [phone]

## 2024-05-22 ENCOUNTER — Emergency Department (HOSPITAL_BASED_OUTPATIENT_CLINIC_OR_DEPARTMENT_OTHER)

## 2024-05-22 ENCOUNTER — Other Ambulatory Visit: Payer: Self-pay

## 2024-05-22 ENCOUNTER — Emergency Department (HOSPITAL_BASED_OUTPATIENT_CLINIC_OR_DEPARTMENT_OTHER)
Admission: EM | Admit: 2024-05-22 | Discharge: 2024-05-22 | Disposition: A | Discharge status: Home / Self Care | Attending: Emergency Medicine | Admitting: Emergency Medicine

## 2024-05-22 ENCOUNTER — Encounter (HOSPITAL_BASED_OUTPATIENT_CLINIC_OR_DEPARTMENT_OTHER): Payer: Self-pay | Admitting: Emergency Medicine

## 2024-05-22 DIAGNOSIS — Z3A01 Less than 8 weeks gestation of pregnancy: Secondary | ICD-10-CM | POA: Insufficient documentation

## 2024-05-22 DIAGNOSIS — Z91040 Latex allergy status: Secondary | ICD-10-CM | POA: Insufficient documentation

## 2024-05-22 DIAGNOSIS — R1110 Vomiting, unspecified: Secondary | ICD-10-CM

## 2024-05-22 DIAGNOSIS — O210 Mild hyperemesis gravidarum: Secondary | ICD-10-CM | POA: Diagnosis not present

## 2024-05-22 DIAGNOSIS — R079 Chest pain, unspecified: Secondary | ICD-10-CM | POA: Insufficient documentation

## 2024-05-22 DIAGNOSIS — O26891 Other specified pregnancy related conditions, first trimester: Secondary | ICD-10-CM | POA: Diagnosis present

## 2024-05-22 DIAGNOSIS — N939 Abnormal uterine and vaginal bleeding, unspecified: Secondary | ICD-10-CM | POA: Insufficient documentation

## 2024-05-22 DIAGNOSIS — O4690 Antepartum hemorrhage, unspecified, unspecified trimester: Secondary | ICD-10-CM

## 2024-05-22 LAB — CBC WITH DIFFERENTIAL/PLATELET
Abs Immature Granulocytes: 0.09 K/uL — ABNORMAL HIGH (ref 0.00–0.07)
Basophils Absolute: 0 K/uL (ref 0.0–0.1)
Basophils Relative: 0 %
Eosinophils Absolute: 0 K/uL (ref 0.0–0.5)
Eosinophils Relative: 0 %
HCT: 41.1 % (ref 36.0–46.0)
Hemoglobin: 13.7 g/dL (ref 12.0–15.0)
Immature Granulocytes: 1 %
Lymphocytes Relative: 13 %
Lymphs Abs: 2 K/uL (ref 0.7–4.0)
MCH: 27.5 pg (ref 26.0–34.0)
MCHC: 33.3 g/dL (ref 30.0–36.0)
MCV: 82.5 fL (ref 80.0–100.0)
Monocytes Absolute: 0.8 K/uL (ref 0.1–1.0)
Monocytes Relative: 5 %
Neutro Abs: 12.8 K/uL — ABNORMAL HIGH (ref 1.7–7.7)
Neutrophils Relative %: 81 %
Platelets: 229 K/uL (ref 150–400)
RBC: 4.98 MIL/uL (ref 3.87–5.11)
RDW: 14.2 % (ref 11.5–15.5)
WBC: 15.8 K/uL — ABNORMAL HIGH (ref 4.0–10.5)
nRBC: 0 % (ref 0.0–0.2)

## 2024-05-22 LAB — COMPREHENSIVE METABOLIC PANEL WITH GFR
ALT: 13 U/L (ref 0–44)
AST: 15 U/L (ref 15–41)
Albumin: 4.5 g/dL (ref 3.5–5.0)
Alkaline Phosphatase: 71 U/L (ref 38–126)
Anion gap: 20 — ABNORMAL HIGH (ref 5–15)
BUN: 11 mg/dL (ref 6–20)
CO2: 15 mmol/L — ABNORMAL LOW (ref 22–32)
Calcium: 10.2 mg/dL (ref 8.9–10.3)
Chloride: 98 mmol/L (ref 98–111)
Creatinine, Ser: 0.71 mg/dL (ref 0.44–1.00)
GFR, Estimated: >60 mL/min (ref >60)
Glucose, Bld: 107 mg/dL — ABNORMAL HIGH (ref 70–99)
Potassium: 3.6 mmol/L (ref 3.5–5.1)
Sodium: 133 mmol/L — ABNORMAL LOW (ref 135–145)
Total Bilirubin: 0.5 mg/dL (ref 0.0–1.2)
Total Protein: 8 g/dL (ref 6.5–8.1)

## 2024-05-22 LAB — D-DIMER, QUANTITATIVE: D-Dimer, Quant: 0.48 ug{FEU}/mL (ref 0.00–0.50)

## 2024-05-22 LAB — TROPONIN T, HIGH SENSITIVITY
Troponin T High Sensitivity: <6 ng/L (ref <19)
Troponin T High Sensitivity: <15 ng/L (ref <19)

## 2024-05-22 LAB — LIPASE, BLOOD: Lipase: 25 U/L (ref 11–51)

## 2024-05-22 LAB — HCG, QUANTITATIVE, PREGNANCY: hCG, Beta Chain, Quant, S: 125206 m[IU]/mL — ABNORMAL HIGH (ref <5)

## 2024-05-22 MED ORDER — ONDANSETRON HCL 4 MG/2ML IJ SOLN
4.0000 mg | Freq: Once | INTRAMUSCULAR | Status: AC
  Administered 2024-05-22: 4 mg via INTRAVENOUS
  Filled 2024-05-22: qty 2

## 2024-05-22 MED ORDER — PANTOPRAZOLE SODIUM 40 MG IV SOLR
40.0000 mg | Freq: Once | INTRAVENOUS | Status: AC
  Administered 2024-05-22: 40 mg via INTRAVENOUS
  Filled 2024-05-22: qty 10

## 2024-05-22 MED ORDER — ACETAMINOPHEN 325 MG PO TABS
650.0000 mg | ORAL_TABLET | Freq: Once | ORAL | Status: AC
  Administered 2024-05-22: 650 mg via ORAL
  Filled 2024-05-22: qty 2

## 2024-05-22 MED ORDER — SODIUM CHLORIDE 0.9 % IV BOLUS: 1000.0000 mL | Freq: Once | INTRAVENOUS | Status: DC

## 2024-05-22 MED ORDER — LACTATED RINGERS IV BOLUS
1000.0000 mL | Freq: Once | INTRAVENOUS | Status: AC
  Administered 2024-05-22: 1000 mL via INTRAVENOUS

## 2024-05-22 NOTE — Discharge Instructions (Signed)
 Continue the medications that you are prescribed previously to help with your nausea and vomiting.  Follow-up with your OB/GYN doctor to be rechecked

## 2024-05-22 NOTE — ED Triage Notes (Signed)
 Chest pain since last admission, states emesis and GERD also states vaginal bleeding X 2 weeks was DC'd from IP with same.

## 2024-05-22 NOTE — ED Provider Notes (Signed)
 [facility] Provider Note   CSN: [id number] Arrival date & time: 05/22/24  9371     Patient presents with: Chest Pain and Vaginal Bleeding   Nichole Patterson is a 33 y.o. female.    Chest Pain Vaginal Bleeding  Patient is 9 weeks 6 days.  Patient has history of asthma heart murmur headaches trichomonas.  Patient presents ED with complaints of chest pain.  She is also having persistent vaginal bleeding.  Patient states the symptoms have been ongoing since July 3.  Patient was previously diagnosed with hyperemesis.  She was having persistent nausea and vomiting.  Patient states she continues to have the symptoms and it has not gotten any better.  She has pain on the left side of her chest.  It increases when she vomits.  Does not get worse with activity or eating.  Patient was previously admitted to the hospital on July 3.  Patient was admitted to the hospital recently on the 12th for the symptoms.  Patient was treated with IV fluids.  She was discharged on the 18th.  She was treated for hyperemesis.  Patient states she has not gotten any better since leaving the hospital.  She has not followed up anyone since leaving the hospital she just has been staying in bed.  Prior to Admission medications   Medication Sig Start Date End Date Taking? Authorizing Provider  capsicum (ZOSTRIX) 0.075 % topical cream Apply topically 2 (two) times daily as needed (cannabinoid-induced nausea). 05/16/24   Erik Kieth BROCKS, MD  doxylamine , Sleep, (UNISOM ) 25 MG tablet Take 1 tablet (25 mg total) by mouth 2 (two) times daily. 05/16/24   Erik Kieth BROCKS, MD  famotidine  (PEPCID ) 40 MG tablet Take 1 tablet (40 mg total) by mouth daily. 05/02/24   Cresenzo, Norleen GAILS, MD  glycopyrrolate  (ROBINUL ) 1 MG tablet Take 2 tablets (2 mg total) by mouth 3 (three) times daily as needed (spitting). 05/16/24   Erik Kieth BROCKS, MD  methylPREDNISolone  (MEDROL ) 4 MG tablet Take 2 tablets  (8 mg total) by mouth 2 (two) times daily for 2 days, THEN 2 tablets (8 mg total) in the morning for 4 days, THEN 1 tablet (4 mg total) in the morning for 4 days. 05/16/24 05/26/24  Erik Kieth BROCKS, MD  metoCLOPramide  (REGLAN ) 10 MG tablet Take 1 tablet (10 mg total) by mouth every 6 (six) hours as needed for nausea. 05/16/24   Erik Kieth BROCKS, MD  pantoprazole  (PROTONIX ) 40 MG tablet Take 1 tablet (40 mg total) by mouth daily. 05/17/24   Erik Kieth BROCKS, MD  Prenatal Vit-Fe Phos-FA-Omega (VITAFOL  GUMMIES) 3.33-0.333-34.8 MG CHEW Chew 1 tablet by mouth daily. 05/24/22   Gene Lucie BROCKS, CNM  pyridOXINE  (VITAMIN B6) 25 MG tablet Take 1 tablet (25 mg total) by mouth 2 (two) times daily. 05/16/24   Erik Kieth BROCKS, MD  scopolamine  (TRANSDERM-SCOP) 1 MG/3DAYS Place 1 patch (1.5 mg total) onto the skin every 3 (three) days. 05/16/24   Erik Kieth BROCKS, MD    Allergies: Food and Latex    Review of Systems  Cardiovascular:  Positive for chest pain.  Genitourinary:  Positive for vaginal bleeding.    Updated Vital Signs BP 120/80   Pulse 92   Temp 97.9 F (36.6 C) (Oral)   Resp 19   Ht 1.702 m (5' 7)   Wt 73.9 kg   LMP 03/24/2024 (Exact Date)   SpO2 98%   BMI 25.53 kg/m  Physical Exam Vitals and nursing note reviewed.  Constitutional:      Appearance: She is well-developed.  HENT:     Head: Normocephalic and atraumatic.     Right Ear: External ear normal.     Left Ear: External ear normal.  Eyes:     General: No scleral icterus.       Right eye: No discharge.        Left eye: No discharge.     Conjunctiva/sclera: Conjunctivae normal.  Neck:     Trachea: No tracheal deviation.  Cardiovascular:     Rate and Rhythm: Normal rate and regular rhythm.  Pulmonary:     Effort: Pulmonary effort is normal. No respiratory distress.     Breath sounds: Normal breath sounds. No stridor. No wheezing or rales.  Abdominal:     General: Bowel sounds are normal. There is no  distension.     Palpations: Abdomen is soft.     Tenderness: There is abdominal tenderness. There is no guarding or rebound.     Comments: Tenderness palpation suprapubic region  Musculoskeletal:        General: No tenderness or deformity.     Cervical back: Neck supple.  Skin:    General: Skin is warm and dry.     Findings: No rash.  Neurological:     General: No focal deficit present.     Mental Status: She is alert.     Cranial Nerves: No cranial nerve deficit, dysarthria or facial asymmetry.     Sensory: No sensory deficit.     Motor: No abnormal muscle tone or seizure activity.     Coordination: Coordination normal.  Psychiatric:        Mood and Affect: Mood normal.     (all labs ordered are listed, but only abnormal results are displayed) Labs Reviewed  COMPREHENSIVE METABOLIC PANEL WITH GFR - Abnormal; Notable for the following components:      Result Value   Sodium 133 (*)    CO2 15 (*)    Glucose, Bld 107 (*)    Anion gap 20 (*)    All other components within normal limits  CBC WITH DIFFERENTIAL/PLATELET - Abnormal; Notable for the following components:   WBC 15.8 (*)    Neutro Abs 12.8 (*)    Abs Immature Granulocytes 0.09 (*)    All other components within normal limits  HCG, QUANTITATIVE, PREGNANCY - Abnormal; Notable for the following components:   hCG, Beta Chain, Quant, S 125,206 (*)    All other components within normal limits  LIPASE, BLOOD  D-DIMER, QUANTITATIVE  TROPONIN T, HIGH SENSITIVITY  TROPONIN T, HIGH SENSITIVITY    EKG: EKG Interpretation Date/Time:  Thursday May 22 2024 06:51:58 EDT Ventricular Rate:  100 PR Interval:  129 QRS Duration:  70 QT Interval:  317 QTC Calculation: 409 R Axis:   82  Text Interpretation: Sinus tachycardia Right atrial enlargement Confirmed by Griselda Norris [id number] on 05/22/2024 6:57:19 AM  Radiology: [facility] Chest 1 View Result Date: 05/22/2024 CLINICAL DATA:  Chest pain.  [redacted] weeks pregnant. EXAM: CHEST  1  VIEW COMPARISON:  None Available. FINDINGS: The lungs are clear without focal pneumonia, edema, pneumothorax or pleural effusion. The cardiopericardial silhouette is within normal limits for size. No acute bony abnormality. Telemetry leads overlie the chest. IMPRESSION: No active disease. Electronically Signed   By: Camellia Candle M.D.   On: 05/22/2024 08:57   US  OB Comp < 14 Wks Result Date: 05/22/2024 CLINICAL  DATA:  Vaginal bleeding with positive pregnancy test. EXAM: OBSTETRIC <14 WK ULTRASOUND TECHNIQUE: Transabdominal ultrasound was performed for evaluation of the gestation as well as the maternal uterus and adnexal regions. COMPARISON:  05/12/2024 FINDINGS: Intrauterine gestational sac: Single Yolk sac:  Not visualized Embryo:  Visualized Cardiac Activity: Visualized Heart Rate: 178 bpm MSD:  45 mm    w     d CRL:   37.1 mm   10 w 4 d                  US  EDC: 12/14/2024 Subchorionic hemorrhage: Moderate subchorionic hemorrhage again noted similar to prior. Maternal uterus/adnexae: Similar appearance probable corpus luteum cyst right ovary. IMPRESSION: Single living intrauterine gestation at estimated 10 week 4 day gestational age by crown-rump length. Moderate subchorionic hemorrhage. Electronically Signed   By: Camellia Candle M.D.   On: 05/22/2024 08:57     Procedures   Medications Ordered in the ED  ondansetron  (ZOFRAN ) injection 4 mg (4 mg Intravenous Given 05/22/24 0754)  pantoprazole  (PROTONIX ) injection 40 mg (40 mg Intravenous Given 05/22/24 0757)  lactated ringers  bolus 1,000 mL (0 mLs Intravenous Stopped 05/22/24 0951)  acetaminophen  (TYLENOL ) tablet 650 mg (650 mg Oral Given 05/22/24 0951)    Clinical Course as of 05/22/24 1057  Thu May 22, 2024  0815 hCG, quantitative, pregnancy(!) Quant hCG increased compared to previous [JK]  0815 CBC with Differential(!) White blood cell count elevated, similar to previous [JK]  0847 Patient states her nausea is better [JK]  0937 Chest x-ray  does not show any acute findings [JK]  0937 Ultrasound shows single IUP at 10 weeks 4 days with moderate subchorionic hemorrhage [JK]  1030 D-dimer, quantitative D-dimer negative [JK]  1047 Delta troponin normal [JK]    Clinical Course User Index [JK] Randol Simmonds, MD                                 Medical Decision Making Chest pain may be related to esophageal irritation, acute coronary syndrome, pneumonia, pneumothorax, pulmonary embolism  Nausea and vomiting likely related to her pregnancy but she could have severe electrolyte abnormalities dehydration  Previously noted to have scar chorionic hemorrhage.  Vaginal bleeding likely related to that but we will proceed with ultrasound and quant hCG.  No heavy bleeding or needing  Problems Addressed: Chest pain, unspecified type: acute illness or injury that poses a threat to life or bodily functions Hyperemesis: acute illness or injury that poses a threat to life or bodily functions Vaginal bleeding in pregnancy: acute illness or injury that poses a threat to life or bodily functions  Amount and/or Complexity of Data Reviewed Labs: ordered. Decision-making details documented in ED Course. Radiology: ordered.  Risk OTC drugs. Prescription drug management.   Patient's ED workup did not show signs of decreased CO2 and elevated anion gap.  No significant elevation of BUN and creatinine.  Suspect this is related to her vomiting and dehydration.  Patient does not evidence of hepatitis or pancreatitis.  Patient was treated with several boluses of IV fluids and antiemetics.  Her symptoms have improved and she has not had any further nausea vomiting.  Patient is feeling much better.  Ultrasound confirms an IUP.  She does have subchorionic hemorrhage.  Patient's chest pain likely related to her nausea vomiting.  There is no evidence of acute cardiac injury.  EKG and troponins are reassuring.  D-dimer is also  negative.  Doubt PE.  Will have  patient continue her home medications.  Follow-up with her OB/GYN to be rechecked    Final diagnoses:  Vaginal bleeding in pregnancy  Chest pain, unspecified type  Hyperemesis    ED Discharge Orders     None          Randol Simmonds, MD 05/22/24 1057
